# Patient Record
Sex: Male | Born: 1935 | Race: White | Hispanic: No | Smoking: Never smoker
Health system: Southern US, Community
[De-identification: ages and names within clinical notes are randomized; demographics above are authoritative.]

## PROBLEM LIST (undated history)

## (undated) DIAGNOSIS — K8020 Calculus of gallbladder without cholecystitis without obstruction: Secondary | ICD-10-CM

## (undated) DIAGNOSIS — I10 Essential (primary) hypertension: Secondary | ICD-10-CM

## (undated) DIAGNOSIS — J189 Pneumonia, unspecified organism: Secondary | ICD-10-CM

## (undated) DIAGNOSIS — M1990 Unspecified osteoarthritis, unspecified site: Secondary | ICD-10-CM

## (undated) DIAGNOSIS — R233 Spontaneous ecchymoses: Secondary | ICD-10-CM

## (undated) DIAGNOSIS — J40 Bronchitis, not specified as acute or chronic: Secondary | ICD-10-CM

## (undated) DIAGNOSIS — F329 Major depressive disorder, single episode, unspecified: Secondary | ICD-10-CM

## (undated) DIAGNOSIS — H409 Unspecified glaucoma: Secondary | ICD-10-CM

## (undated) DIAGNOSIS — J45909 Unspecified asthma, uncomplicated: Secondary | ICD-10-CM

## (undated) DIAGNOSIS — K219 Gastro-esophageal reflux disease without esophagitis: Secondary | ICD-10-CM

## (undated) DIAGNOSIS — F32A Depression, unspecified: Secondary | ICD-10-CM

## (undated) DIAGNOSIS — R0602 Shortness of breath: Secondary | ICD-10-CM

## (undated) DIAGNOSIS — H9190 Unspecified hearing loss, unspecified ear: Secondary | ICD-10-CM

## (undated) DIAGNOSIS — K449 Diaphragmatic hernia without obstruction or gangrene: Secondary | ICD-10-CM

## (undated) DIAGNOSIS — R238 Other skin changes: Secondary | ICD-10-CM

## (undated) HISTORY — DX: Shortness of breath: R06.02

## (undated) HISTORY — DX: Gastro-esophageal reflux disease without esophagitis: K21.9

## (undated) HISTORY — DX: Unspecified hearing loss, unspecified ear: H91.90

## (undated) HISTORY — DX: Spontaneous ecchymoses: R23.3

## (undated) HISTORY — DX: Other skin changes: R23.8

## (undated) HISTORY — PX: OTHER SURGICAL HISTORY: SHX169

## (undated) HISTORY — DX: Unspecified asthma, uncomplicated: J45.909

## (undated) HISTORY — DX: Calculus of gallbladder without cholecystitis without obstruction: K80.20

## (undated) HISTORY — PX: APPENDECTOMY: SHX54

## (undated) HISTORY — PX: LITHOTRIPSY: SUR834

## (undated) HISTORY — DX: Bronchitis, not specified as acute or chronic: J40

## (undated) HISTORY — DX: Essential (primary) hypertension: I10

## (undated) HISTORY — DX: Diaphragmatic hernia without obstruction or gangrene: K44.9

## (undated) HISTORY — DX: Unspecified glaucoma: H40.9

---

## 2003-03-03 HISTORY — PX: EYE SURGERY: SHX253

## 2012-04-22 HISTORY — PX: ERCP: SHX60

## 2012-04-29 ENCOUNTER — Ambulatory Visit (INDEPENDENT_AMBULATORY_CARE_PROVIDER_SITE_OTHER): Payer: Medicare Other | Admitting: General Surgery

## 2012-04-29 ENCOUNTER — Encounter (INDEPENDENT_AMBULATORY_CARE_PROVIDER_SITE_OTHER): Payer: Self-pay | Admitting: General Surgery

## 2012-04-29 VITALS — BP 126/70 | HR 74 | Temp 97.9°F | Resp 18 | Ht 66.0 in | Wt 115.2 lb

## 2012-04-29 DIAGNOSIS — K8070 Calculus of gallbladder and bile duct without cholecystitis without obstruction: Secondary | ICD-10-CM | POA: Insufficient documentation

## 2012-04-29 NOTE — Patient Instructions (Signed)
[facility] ______[facility] °LAPAROSCOPIC SURGERY: POST OP INSTRUCTIONS °Always review your discharge instruction sheet given to you by the facility where your surgery was performed. °IF YOU HAVE DISABILITY OR FAMILY LEAVE FORMS, YOU MUST BRING THEM TO THE OFFICE FOR PROCESSING.   °DO NOT GIVE THEM TO YOUR DOCTOR. ° °1. A prescription for pain medication may be given to you upon discharge.  Take your pain medication as prescribed, if needed.  If narcotic pain medicine is not needed, then you may take acetaminophen (Tylenol) or ibuprofen (Advil) as needed. °2. Take your usually prescribed medications unless otherwise directed. °3. If you need a refill on your pain medication, please contact your pharmacy.  They will contact our office to request authorization. Prescriptions will not be filled after 5pm or on week-ends. °4. You should follow a light diet the first few days after arrival home, such as soup and crackers, etc.  Be sure to include lots of fluids daily. °5. Most patients will experience some swelling and bruising in the area of the incisions.  Ice packs will help.  Swelling and bruising can take several days to resolve.  °6. It is common to experience some constipation if taking pain medication after surgery.  Increasing fluid intake and taking a stool softener (such as Colace) will usually help or prevent this problem from occurring.  A mild laxative (Milk of Magnesia or Miralax) should be taken according to package instructions if there are no bowel movements after 48 hours. °7. Unless discharge instructions indicate otherwise, you may remove your bandages 24-48 hours after surgery, and you may shower at that time.  You may have steri-strips (small skin tapes) in place directly over the incision.  These strips should be left on the skin for 7-10 days.  If your surgeon used skin glue on the incision, you may shower in 24 hours.  The glue will flake off over the next 2-3 weeks.  Any sutures or  staples will be removed at the office during your follow-up visit. °8. ACTIVITIES:  You may resume regular (light) daily activities beginning the next day--such as daily self-care, walking, climbing stairs--gradually increasing activities as tolerated.  You may have sexual intercourse when it is comfortable.  Refrain from any heavy lifting or straining until approved by your doctor. °a. You may drive when you are no longer taking prescription pain medication, you can comfortably wear a seatbelt, and you can safely maneuver your car and apply brakes. °b. RETURN TO WORK:  __________________________________________________________ °9. You should see your doctor in the office for a follow-up appointment approximately 2-3 weeks after your surgery.  Make sure that you call for this appointment within a day or two after you arrive home to insure a convenient appointment time. °10. OTHER INSTRUCTIONS: __________________________________________________________________________________________________________________________ __________________________________________________________________________________________________________________________ °WHEN TO CALL YOUR DOCTOR: °1. Fever over 101.0 °2. Inability to urinate °3. Continued bleeding from incision. °4. Increased pain, redness, or drainage from the incision. °5. Increasing abdominal pain ° °The clinic staff is available to answer your questions during regular business hours.  Please don’t hesitate to call and ask to speak to one of the nurses for clinical concerns.  If you have a medical emergency, go to the nearest emergency room or call 911.  A surgeon from [facility] is always on call at the hospital. °[address], [address], [place], [place]  [address] ? [address] [address], [place], [place]   [address] °[phone] ? [phone] ? FAX [phone] °Web site:   [email or web address] °

## 2012-04-29 NOTE — Progress Notes (Signed)
Patient ID: Lucas Kelly, male   DOB: 10/10/1925, 79 y.o.   MRN: [id number]  Chief Complaint  Patient presents with  . Cholelithiasis    HPI Lucas Kelly is a 79 y.o. male.   HPI  He is self-referred and his daughter is here with him. He has cholelithiasis and had choledocholithiasis. He underwent ERCP twice in [place] and the stone was able to be removed. His main symptoms were nausea with some vomiting. He still having some intermittent nausea especially after eating spicy foods. His daughter lives here in town and given that he would need help after surgery he presents here to discuss cholecystectomy which he is interested in having.  Past Medical History  Diagnosis Date  . Hypertension   . COPD with FEV1 38%.   . Asthma   . Shortness of breath   . Bruises easily   . Hiatal hernia   . Acid reflux   . Kidney stone   . Gallstones   . Hearing loss   . Glaucoma     Past Surgical History  Procedure Laterality Date  . Ercp  04/22/12  . Arm fracture surgery  1996 - approximate    right  . Lithotripsy  1984 - approximate  . Appendectomy      about 50 years ago, does not remember exact date   Left nephrolithomy  Family History  Problem Relation Age of Onset  . Heart attack Mother   . Hypertension Sister   . Heart disease Brother     Social History History  Substance Use Topics  . Smoking status: Never Smoker   . Smokeless tobacco: Never Used  . Alcohol Use: No    Allergies  Allergen Reactions  . Biaxin (Clarithromycin) Other (See Comments)    Hallucinations.  . Hctz (Hydrochlorothiazide) Other (See Comments)    Made patient feel like he had no energy, weak, not like himself.  . Levaquin (Levofloxacin In D5w) Other (See Comments)    Muscle spasms in lower legs.    Current Outpatient Prescriptions  Medication Sig Dispense Refill  . aspirin 81 MG tablet Take 81 mg by mouth daily.      . budesonide-formoterol (SYMBICORT) 80-4.5 MCG/ACT inhaler  Inhale 1 puff into the lungs 2 (two) times daily.      . carvedilol (COREG) 25 MG tablet daily.      . LORazepam (ATIVAN) 0.5 MG tablet Take 0.5 mg by mouth at bedtime. Patient takes half of tablet.      . losartan (COZAAR) 50 MG tablet daily.      . timolol (TIMOPTIC) 0.5 % ophthalmic solution daily.       No current facility-administered medications for this visit.    Review of Systems Review of Systems  Constitutional: Negative.   HENT: Positive for hearing loss.   Respiratory: Positive for shortness of breath.   Cardiovascular: Negative.   Gastrointestinal:       Reflux.  Hematological: Bruises/bleeds easily.    Blood pressure 126/70, pulse 74, temperature 97.9 F (36.6 C), temperature source Temporal, resp. rate 18, height 5' 6" (1.676 m), weight 115 lb 4 oz (52.277 kg).  Physical Exam Physical Exam  Constitutional:  Thin, elderly male in no acute distress.  Neck: Neck supple.  Cardiovascular: Normal rate and regular rhythm.   Pulmonary/Chest: Effort normal.  Breath sounds distant but clear. No wheezing.  Abdominal: Soft. He exhibits no distension and no mass. There is no tenderness.  Right lower quadrant scar.   Left flank scar.  Musculoskeletal: He exhibits no edema.  Some ecchymosis in the upper extremities. Some muscular wasting  Lymphadenopathy:    He has no cervical adenopathy.  Neurological: He is alert.  Skin: Skin is warm and dry.    Data Reviewed ERCP report. Primary care notes.  Assessment    Symptomatic cholelithiasis and choledocholithiasis. ERCP and stone extraction has been performed. He had no problems with anesthesia during this procedure. He is interested in having a cholecystectomy.     Plan    Laparoscopic possible open cholecystectomy with cholangiogram.  I have explained the procedure, risks, and aftercare of cholecystectomy.  Risks include but are not limited to bleeding, infection, wound problems, anesthesia, diarrhea, bile leak, injury  to common bile duct/liver/intestine.  They seem to understand and agrees to proceed.         Kamarius Buckbee J 04/29/2012, 2:59 PM    

## 2012-05-03 ENCOUNTER — Encounter (HOSPITAL_COMMUNITY): Payer: Self-pay | Admitting: Pharmacy Technician

## 2012-05-06 ENCOUNTER — Encounter (HOSPITAL_COMMUNITY)
Admission: RE | Admit: 2012-05-06 | Discharge: 2012-05-06 | Disposition: A | Discharge status: Home / Self Care | Payer: Medicare Other | Source: Ambulatory Visit | Attending: General Surgery | Admitting: General Surgery

## 2012-05-06 ENCOUNTER — Ambulatory Visit (HOSPITAL_COMMUNITY)
Admission: RE | Admit: 2012-05-06 | Discharge: 2012-05-06 | Disposition: A | Discharge status: Home / Self Care | Payer: Medicare Other | Source: Ambulatory Visit | Attending: General Surgery | Admitting: General Surgery

## 2012-05-06 ENCOUNTER — Encounter (HOSPITAL_COMMUNITY): Payer: Self-pay

## 2012-05-06 DIAGNOSIS — K449 Diaphragmatic hernia without obstruction or gangrene: Secondary | ICD-10-CM | POA: Insufficient documentation

## 2012-05-06 DIAGNOSIS — I498 Other specified cardiac arrhythmias: Secondary | ICD-10-CM | POA: Insufficient documentation

## 2012-05-06 DIAGNOSIS — M4120 Other idiopathic scoliosis, site unspecified: Secondary | ICD-10-CM | POA: Insufficient documentation

## 2012-05-06 DIAGNOSIS — Z01810 Encounter for preprocedural cardiovascular examination: Secondary | ICD-10-CM | POA: Insufficient documentation

## 2012-05-06 DIAGNOSIS — R0602 Shortness of breath: Secondary | ICD-10-CM | POA: Insufficient documentation

## 2012-05-06 DIAGNOSIS — Z01818 Encounter for other preprocedural examination: Secondary | ICD-10-CM | POA: Insufficient documentation

## 2012-05-06 DIAGNOSIS — I10 Essential (primary) hypertension: Secondary | ICD-10-CM | POA: Insufficient documentation

## 2012-05-06 DIAGNOSIS — Z01812 Encounter for preprocedural laboratory examination: Secondary | ICD-10-CM | POA: Insufficient documentation

## 2012-05-06 HISTORY — DX: Major depressive disorder, single episode, unspecified: F32.9

## 2012-05-06 HISTORY — DX: Pneumonia, unspecified organism: J18.9

## 2012-05-06 HISTORY — DX: Depression, unspecified: F32.A

## 2012-05-06 HISTORY — DX: Unspecified osteoarthritis, unspecified site: M19.90

## 2012-05-06 LAB — CBC
HCT: 41.5 % (ref 39.0–52.0)
MCV: 96.5 fL (ref 78.0–100.0)
Platelets: 205 10*3/uL (ref 150–400)
RBC: 4.3 MIL/uL (ref 4.22–5.81)
WBC: 6.4 10*3/uL (ref 4.0–10.5)

## 2012-05-06 LAB — COMPREHENSIVE METABOLIC PANEL
AST: 19 U/L (ref 0–37)
Alkaline Phosphatase: 68 U/L (ref 39–117)
BUN: 22 mg/dL (ref 6–23)
CO2: 30 mEq/L (ref 19–32)
Chloride: 101 mEq/L (ref 96–112)
Creatinine, Ser: 0.9 mg/dL (ref 0.50–1.35)
GFR calc non Af Amer: 75 mL/min — ABNORMAL LOW (ref >90)
Total Bilirubin: 0.8 mg/dL (ref 0.3–1.2)

## 2012-05-06 LAB — PROTIME-INR: INR: 0.97 (ref 0.00–1.49)

## 2012-05-06 LAB — SURGICAL PCR SCREEN
MRSA, PCR: NEGATIVE
Staphylococcus aureus: NEGATIVE

## 2012-05-06 NOTE — Progress Notes (Signed)
05/06/12 1430  OBSTRUCTIVE SLEEP APNEA  Have you ever been diagnosed with sleep apnea through a sleep study? No  Do you snore loudly (loud enough to be heard through closed doors)?  0  Do you often feel tired, fatigued, or sleepy during the daytime? 1  Has anyone observed you stop breathing during your sleep? 0  Do you have, or are you being treated for high blood pressure? 1  BMI more than 35 kg/m2? 0  Age over 77 years old? 1  Neck circumference greater than 40 cm/18 inches? 0  Gender: 1  Obstructive Sleep Apnea Score 4  Score 4 or greater  Results sent to PCP

## 2012-05-06 NOTE — Patient Instructions (Signed)
[address] Audy A. Klar  05/06/2012   Your procedure is scheduled on: 05/10/12  Report to [facility] at 1130 AM.  Call this number if you have problems the morning of surgery [phone]   Remember:   Do not eat food  After Midnight 05/09/12, clear liquids from midnight until 0800am 05/10/12 then nothing     Take these medicines the morning of surgery with A SIP OF WATER: coreg, pepcid if needed, symbicort   Do not wear jewelry, make-up or nail polish.  Do not wear lotions, powders, or perfumes. You may wear deodorant.  Do not shave 48 hours prior to surgery. Men may shave face and neck.  Do not bring valuables to the hospital.  Contacts, dentures or bridgework may not be worn into surgery.  Leave suitcase in the car. After surgery it may be brought to your room.  For patients admitted to the hospital, checkout time is 11:00 AM the day of discharge.     Please read over the following fact sheets that you were given: MRSA Information, clear liquids fact sheet Birdie Sons, RN  pre op nurse call if needed [phone]    FAILURE TO FOLLOW THESE INSTRUCTIONS MAY RESULT IN CANCELLATION OF YOUR SURGERY   Patient Signature: ___________________________________________

## 2012-05-06 NOTE — Progress Notes (Signed)
stress test 04/10/11 on chart, ECHO 03/05/10 on chart

## 2012-05-10 ENCOUNTER — Encounter (HOSPITAL_COMMUNITY)
Admission: RE | Disposition: A | Discharge status: Home / Self Care | Payer: Self-pay | Source: Ambulatory Visit | Attending: General Surgery

## 2012-05-10 ENCOUNTER — Encounter (HOSPITAL_COMMUNITY): Payer: Self-pay | Admitting: Certified Registered Nurse Anesthetist

## 2012-05-10 ENCOUNTER — Ambulatory Visit (HOSPITAL_COMMUNITY): Payer: Medicare Other | Admitting: Certified Registered Nurse Anesthetist

## 2012-05-10 ENCOUNTER — Ambulatory Visit (HOSPITAL_COMMUNITY): Payer: Medicare Other

## 2012-05-10 ENCOUNTER — Encounter (HOSPITAL_COMMUNITY): Payer: Self-pay | Admitting: General Practice

## 2012-05-10 ENCOUNTER — Ambulatory Visit (HOSPITAL_COMMUNITY)
Admission: RE | Admit: 2012-05-10 | Discharge: 2012-05-11 | Disposition: A | Discharge status: Home / Self Care | Payer: Medicare Other | Source: Ambulatory Visit | Attending: General Surgery | Admitting: General Surgery

## 2012-05-10 DIAGNOSIS — K8010 Calculus of gallbladder with chronic cholecystitis without obstruction: Secondary | ICD-10-CM

## 2012-05-10 DIAGNOSIS — K449 Diaphragmatic hernia without obstruction or gangrene: Secondary | ICD-10-CM | POA: Insufficient documentation

## 2012-05-10 DIAGNOSIS — K219 Gastro-esophageal reflux disease without esophagitis: Secondary | ICD-10-CM | POA: Insufficient documentation

## 2012-05-10 DIAGNOSIS — J4489 Other specified chronic obstructive pulmonary disease: Secondary | ICD-10-CM | POA: Insufficient documentation

## 2012-05-10 DIAGNOSIS — H409 Unspecified glaucoma: Secondary | ICD-10-CM | POA: Insufficient documentation

## 2012-05-10 DIAGNOSIS — Z7982 Long term (current) use of aspirin: Secondary | ICD-10-CM | POA: Insufficient documentation

## 2012-05-10 DIAGNOSIS — I10 Essential (primary) hypertension: Secondary | ICD-10-CM | POA: Insufficient documentation

## 2012-05-10 DIAGNOSIS — R110 Nausea: Secondary | ICD-10-CM | POA: Insufficient documentation

## 2012-05-10 DIAGNOSIS — Z79899 Other long term (current) drug therapy: Secondary | ICD-10-CM | POA: Insufficient documentation

## 2012-05-10 DIAGNOSIS — J449 Chronic obstructive pulmonary disease, unspecified: Secondary | ICD-10-CM | POA: Insufficient documentation

## 2012-05-10 HISTORY — PX: CHOLECYSTECTOMY: SHX55

## 2012-05-10 SURGERY — LAPAROSCOPIC CHOLECYSTECTOMY WITH INTRAOPERATIVE CHOLANGIOGRAM
Anesthesia: General | Site: Abdomen | Wound class: Clean Contaminated

## 2012-05-10 MED ORDER — LIDOCAINE HCL (CARDIAC) 20 MG/ML IV SOLN
INTRAVENOUS | Status: DC | PRN
  Administered 2012-05-10: 100 mg via INTRAVENOUS

## 2012-05-10 MED ORDER — BUPIVACAINE HCL (PF) 0.5 % IJ SOLN
INTRAMUSCULAR | Status: AC
  Filled 2012-05-10: qty 30

## 2012-05-10 MED ORDER — IOHEXOL 300 MG/ML  SOLN
INTRAMUSCULAR | Status: DC | PRN
  Administered 2012-05-10: 4.5 mL via INTRAVENOUS

## 2012-05-10 MED ORDER — PROPOFOL 10 MG/ML IV BOLUS
INTRAVENOUS | Status: DC | PRN
  Administered 2012-05-10: 100 mg via INTRAVENOUS

## 2012-05-10 MED ORDER — EPHEDRINE SULFATE 50 MG/ML IJ SOLN
INTRAMUSCULAR | Status: DC | PRN
  Administered 2012-05-10: 10 mg via INTRAVENOUS
  Administered 2012-05-10: 5 mg via INTRAVENOUS

## 2012-05-10 MED ORDER — LACTATED RINGERS IV SOLN
INTRAVENOUS | Status: DC
  Administered 2012-05-10: 14:00:00 via INTRAVENOUS

## 2012-05-10 MED ORDER — KCL-LACTATED RINGERS-D5W 20 MEQ/L IV SOLN
INTRAVENOUS | Status: DC
  Administered 2012-05-10 – 2012-05-11 (×2): via INTRAVENOUS
  Filled 2012-05-10 (×2): qty 1000

## 2012-05-10 MED ORDER — SUCCINYLCHOLINE CHLORIDE 20 MG/ML IJ SOLN
INTRAMUSCULAR | Status: DC | PRN
  Administered 2012-05-10: 100 mg via INTRAVENOUS

## 2012-05-10 MED ORDER — NEOSTIGMINE METHYLSULFATE 1 MG/ML IJ SOLN
INTRAMUSCULAR | Status: DC | PRN
  Administered 2012-05-10: 5 mg via INTRAVENOUS

## 2012-05-10 MED ORDER — CEFAZOLIN SODIUM-DEXTROSE 2-3 GM-% IV SOLR
2.0000 g | INTRAVENOUS | Status: AC
  Administered 2012-05-10: 2 g via INTRAVENOUS

## 2012-05-10 MED ORDER — FAMOTIDINE 20 MG PO TABS
20.0000 mg | ORAL_TABLET | Freq: Two times a day (BID) | ORAL | Status: DC | PRN
  Administered 2012-05-11: 20 mg via ORAL
  Filled 2012-05-10 (×2): qty 1

## 2012-05-10 MED ORDER — ONDANSETRON HCL 4 MG/2ML IJ SOLN
INTRAMUSCULAR | Status: DC | PRN
  Administered 2012-05-10: 4 mg via INTRAVENOUS

## 2012-05-10 MED ORDER — IOHEXOL 300 MG/ML  SOLN
INTRAMUSCULAR | Status: AC
  Filled 2012-05-10: qty 1

## 2012-05-10 MED ORDER — MORPHINE SULFATE 2 MG/ML IJ SOLN
1.0000 mg | INTRAMUSCULAR | Status: DC | PRN
  Administered 2012-05-10 (×3): 2 mg via INTRAVENOUS
  Filled 2012-05-10 (×3): qty 1

## 2012-05-10 MED ORDER — HYDROCODONE-ACETAMINOPHEN 5-325 MG PO TABS
1.0000 | ORAL_TABLET | ORAL | Status: DC | PRN
  Administered 2012-05-11: 1 via ORAL
  Filled 2012-05-10: qty 1

## 2012-05-10 MED ORDER — BUPIVACAINE HCL 0.5 % IJ SOLN
INTRAMUSCULAR | Status: DC | PRN
  Administered 2012-05-10: 15 mL

## 2012-05-10 MED ORDER — LACTATED RINGERS IR SOLN
Status: DC | PRN
  Administered 2012-05-10: 1000 mL

## 2012-05-10 MED ORDER — CEFAZOLIN SODIUM-DEXTROSE 2-3 GM-% IV SOLR
INTRAVENOUS | Status: AC
  Filled 2012-05-10: qty 50

## 2012-05-10 MED ORDER — LACTATED RINGERS IV SOLN: INTRAVENOUS | Status: DC

## 2012-05-10 MED ORDER — ATROPINE SULFATE 0.4 MG/ML IJ SOLN
INTRAMUSCULAR | Status: DC | PRN
  Administered 2012-05-10: 0.4 mg via INTRAVENOUS

## 2012-05-10 MED ORDER — LOSARTAN POTASSIUM 50 MG PO TABS
50.0000 mg | ORAL_TABLET | Freq: Every morning | ORAL | Status: DC
  Filled 2012-05-10: qty 1

## 2012-05-10 MED ORDER — LORAZEPAM 0.5 MG PO TABS
0.2500 mg | ORAL_TABLET | Freq: Every evening | ORAL | Status: DC | PRN
  Administered 2012-05-10: 0.25 mg via ORAL
  Filled 2012-05-10: qty 1

## 2012-05-10 MED ORDER — BUDESONIDE-FORMOTEROL FUMARATE 80-4.5 MCG/ACT IN AERO
1.0000 | INHALATION_SPRAY | Freq: Two times a day (BID) | RESPIRATORY_TRACT | Status: DC
  Administered 2012-05-10: 1 via RESPIRATORY_TRACT
  Filled 2012-05-10: qty 6.9

## 2012-05-10 MED ORDER — CARVEDILOL 12.5 MG PO TABS
12.5000 mg | ORAL_TABLET | Freq: Two times a day (BID) | ORAL | Status: DC
  Administered 2012-05-10 – 2012-05-11 (×2): 12.5 mg via ORAL
  Filled 2012-05-10 (×4): qty 1

## 2012-05-10 MED ORDER — DEXAMETHASONE SODIUM PHOSPHATE 10 MG/ML IJ SOLN
INTRAMUSCULAR | Status: DC | PRN
  Administered 2012-05-10: 10 mg via INTRAVENOUS

## 2012-05-10 MED ORDER — FENTANYL CITRATE 0.05 MG/ML IJ SOLN
INTRAMUSCULAR | Status: DC | PRN
  Administered 2012-05-10: 50 ug via INTRAVENOUS
  Administered 2012-05-10 (×2): 25 ug via INTRAVENOUS

## 2012-05-10 MED ORDER — HEMOSTATIC AGENTS (NO CHARGE) OPTIME
TOPICAL | Status: DC | PRN
  Administered 2012-05-10: 1 via TOPICAL

## 2012-05-10 MED ORDER — TIMOLOL MALEATE 0.5 % OP SOLN
1.0000 [drp] | Freq: Every day | OPHTHALMIC | Status: DC
  Administered 2012-05-11: 1 [drp] via OPHTHALMIC
  Filled 2012-05-10: qty 5

## 2012-05-10 MED ORDER — LIP MEDEX EX OINT
TOPICAL_OINTMENT | CUTANEOUS | Status: AC
  Administered 2012-05-10: 18:00:00
  Filled 2012-05-10: qty 7

## 2012-05-10 MED ORDER — HYDROMORPHONE HCL PF 1 MG/ML IJ SOLN: 0.2500 mg | INTRAMUSCULAR | Status: DC | PRN

## 2012-05-10 MED ORDER — GLYCOPYRROLATE 0.2 MG/ML IJ SOLN
INTRAMUSCULAR | Status: DC | PRN
  Administered 2012-05-10: 0.6 mg via INTRAVENOUS

## 2012-05-10 MED ORDER — 0.9 % SODIUM CHLORIDE (POUR BTL) OPTIME
TOPICAL | Status: DC | PRN
  Administered 2012-05-10: 1000 mL

## 2012-05-10 MED ORDER — ROCURONIUM BROMIDE 100 MG/10ML IV SOLN
INTRAVENOUS | Status: DC | PRN
  Administered 2012-05-10: 30 mg via INTRAVENOUS

## 2012-05-10 SURGICAL SUPPLY — 43 items
APPLIER CLIP 5 13 M/L LIGAMAX5 (MISCELLANEOUS) ×2
APPLIER CLIP ROT 10 11.4 M/L (STAPLE)
BENZOIN TINCTURE PRP APPL 2/3 (GAUZE/BANDAGES/DRESSINGS) ×2 IMPLANT
CANISTER SUCTION 2500CC (MISCELLANEOUS) ×2 IMPLANT
CHLORAPREP W/TINT 26ML (MISCELLANEOUS) ×2 IMPLANT
CLIP APPLIE 5 13 M/L LIGAMAX5 (MISCELLANEOUS) ×1 IMPLANT
CLIP APPLIE ROT 10 11.4 M/L (STAPLE) IMPLANT
CLOTH BEACON ORANGE TIMEOUT ST (SAFETY) ×2 IMPLANT
COVER MAYO STAND STRL (DRAPES) IMPLANT
COVER SURGICAL LIGHT HANDLE (MISCELLANEOUS) IMPLANT
DECANTER SPIKE VIAL GLASS SM (MISCELLANEOUS) ×2 IMPLANT
DRAPE C-ARM 42X72 X-RAY (DRAPES) IMPLANT
DRAPE LAPAROSCOPIC ABDOMINAL (DRAPES) ×2 IMPLANT
DRAPE UTILITY XL STRL (DRAPES) ×2 IMPLANT
DRSG TEGADERM 2-3/8X2-3/4 SM (GAUZE/BANDAGES/DRESSINGS) ×2 IMPLANT
ELECT REM PT RETURN 9FT ADLT (ELECTROSURGICAL) ×2
ELECTRODE REM PT RTRN 9FT ADLT (ELECTROSURGICAL) ×1 IMPLANT
ENDOLOOP SUT PDS II  0 18 (SUTURE)
ENDOLOOP SUT PDS II 0 18 (SUTURE) IMPLANT
GAUZE SPONGE 2X2 8PLY STRL LF (GAUZE/BANDAGES/DRESSINGS) ×1 IMPLANT
GLOVE BIOGEL PI IND STRL 7.0 (GLOVE) ×1 IMPLANT
GLOVE BIOGEL PI INDICATOR 7.0 (GLOVE) ×1
GLOVE ECLIPSE 8.0 STRL XLNG CF (GLOVE) ×2 IMPLANT
GLOVE INDICATOR 8.0 STRL GRN (GLOVE) ×4 IMPLANT
GOWN STRL NON-REIN LRG LVL3 (GOWN DISPOSABLE) ×2 IMPLANT
GOWN STRL REIN XL XLG (GOWN DISPOSABLE) ×4 IMPLANT
HEMOSTAT SURGICEL 4X8 (HEMOSTASIS) IMPLANT
IV CATH 14GX2 1/4 (CATHETERS) IMPLANT
KIT BASIN OR (CUSTOM PROCEDURE TRAY) ×2 IMPLANT
NS IRRIG 1000ML POUR BTL (IV SOLUTION) IMPLANT
POUCH SPECIMEN RETRIEVAL 10MM (ENDOMECHANICALS) IMPLANT
SET CHOLANGIOGRAPH MIX (MISCELLANEOUS) ×2 IMPLANT
SET IRRIG TUBING LAPAROSCOPIC (IRRIGATION / IRRIGATOR) ×2 IMPLANT
SOLUTION ANTI FOG 6CC (MISCELLANEOUS) ×2 IMPLANT
SPONGE GAUZE 2X2 STER 10/PKG (GAUZE/BANDAGES/DRESSINGS) ×1
STRIP CLOSURE SKIN 1/2X4 (GAUZE/BANDAGES/DRESSINGS) ×2 IMPLANT
SUT MNCRL AB 4-0 PS2 18 (SUTURE) ×4 IMPLANT
TOWEL OR 17X26 10 PK STRL BLUE (TOWEL DISPOSABLE) ×2 IMPLANT
TRAY LAP CHOLE (CUSTOM PROCEDURE TRAY) ×2 IMPLANT
TROCAR BLADELESS OPT 5 75 (ENDOMECHANICALS) ×6 IMPLANT
TROCAR XCEL BLUNT TIP 100MML (ENDOMECHANICALS) ×2 IMPLANT
TROCAR XCEL NON-BLD 11X100MML (ENDOMECHANICALS) IMPLANT
TUBING INSUFFLATION 10FT LAP (TUBING) ×2 IMPLANT

## 2012-05-10 NOTE — Op Note (Signed)
Preoperative diagnosis:  Symptomatic cholelithiasis, history of choledocholithiasis s/p ERCP  Postoperative diagnosis:  Same  Procedure: Laparoscopic cholecystectomy with cholangiogram.  Surgeon: Avel Peace, M.D.  Asst.:  Glenna Fellows, M.D.  Anesthesia: Gen.  Indication:   This is an 79 year old male with a history of cholelithiasis and choledocholithiasis status post ERCP. He now presents for elective cholecystectomy.  Technique: He was brought to the operating room, placed supine on the operating table, and a general anesthetic was administered. The hair on the abdominal wall was clipped as was necessary. The abdominal wall was then sterilely prepped and draped. Local anesthetic (Marcaine) was infiltrated in the subumbilical region. A small subumbilical incision was made through the skin, subcutaneous tissue, fascia, and peritoneum entering the peritoneal cavity under direct vision. A pursestring suture of 0 Vicryl was placed around the edges of the fascia. A Hassan trocar was introduced into the peritoneal cavity and a pneumoperitoneum was created by insufflation of carbon dioxide gas. The laparoscope was introduced into the trocar and no underlying bleeding or organ injury was noted. He was then placed in the reverse Trendelenburg position with the right side tilted slightly up.  Three more trochars were then placed into the abdominal cavity under laparoscopic vision. One in the epigastric area, and 2 in the right upper quadrant area. The gallbladder was visualized and the fundus was grasped and retracted toward the right shoulder. No acute inflammatory changes or significant adhesions were present.  The infundibulum was mobilized with dissection close to the gallbladder and retracted laterally. The cystic duct was identified and a window was created around it. The cystic artery was also identified and a window was created around it. The critical view was achieved. A clip was placed at  the neck of the gallbladder. A small incision was made in the cystic duct. A cholangiocatheter was introduced through the anterior abdominal wall and placed in the cystic duct. A intraoperative cholangiogram was then performed.  Under real-time fluoroscopy, dilute contrast was injected into the cystic duct.  The common hepatic duct, the right and left hepatic ducts, and the common duct were all visualized. Contrast drained into the duodenum without obvious evidence of any obstructing ductal lesion. The final report is pending the Radiologist's interpretation.  The cholangiocatheter was removed, the cystic duct was clipped 3 times on the biliary side, and then the cystic duct was divided sharply. No bile leak was noted from the cystic duct stump.  The cystic artery was then clipped and divided. Following this the gallbladder was dissected free from the liver using electrocautery. The gallbladder was then placed in a retrieval bag and removed from the abdominal cavity through the subumbilical incision.  The gallbladder fossa was inspected, irrigated, and bleeding was controlled with electrocautery. Inspection showed that hemostasis was adequate and there was no evidence of bile leak. A piece of Surgicel was placed in the gallbladder fossa. The irrigation fluid was evacuated as much as possible.  The subumbilical trocar was removed and the fascial defect was closed by tightening and tying down the pursestring suture under laparoscopic vision.  The remaining trochars were removed and the pneumoperitoneum was released. The skin incisions were closed with 4-0 Monocryl subcuticular stitches. Steri-Strips and sterile dressings were applied.  The procedure was well-tolerated without any apparent complications. The patient was taken to the recovery room in satisfactory condition.

## 2012-05-10 NOTE — Anesthesia Preprocedure Evaluation (Addendum)
Anesthesia Evaluation  Patient identified by MRN, date of birth, ID band Patient awake    Reviewed: Allergy & Precautions, H&P , NPO status , Patient's Chart, lab work & pertinent test results, reviewed documented beta blocker date and time   Airway Mallampati: II TM Distance: >3 FB Neck ROM: full    Dental no notable dental hx. (+) Teeth Intact and Dental Advisory Given   Pulmonary shortness of breath and with exertion, COPD Occasional bronchitis and mild COPD breath sounds clear to auscultation  Pulmonary exam normal       Cardiovascular Exercise Tolerance: Good hypertension, Pt. on home beta blockers Rhythm:regular Rate:Normal     Neuro/Psych negative neurological ROS  negative psych ROS   GI/Hepatic negative GI ROS, Neg liver ROS, hiatal hernia, GERD-  Medicated and Controlled,  Endo/Other  negative endocrine ROS  Renal/GU negative Renal ROS  negative genitourinary   Musculoskeletal   Abdominal   Peds  Hematology negative hematology ROS (+)   Anesthesia Other Findings   Reproductive/Obstetrics negative OB ROS                          Anesthesia Physical Anesthesia Plan  ASA: II  Anesthesia Plan: General   Post-op Pain Management:    Induction: Intravenous  Airway Management Planned: Oral ETT  Additional Equipment:   Intra-op Plan:   Post-operative Plan: Extubation in OR  Informed Consent: I have reviewed the patients History and Physical, chart, labs and discussed the procedure including the risks, benefits and alternatives for the proposed anesthesia with the patient or authorized representative who has indicated his/her understanding and acceptance.   Dental Advisory Given  Plan Discussed with: CRNA and Surgeon  Anesthesia Plan Comments:        Anesthesia Quick Evaluation

## 2012-05-10 NOTE — H&P (View-Only) (Signed)
Patient ID: Lucas Kelly, male   DOB: 1925-10-21, 79 y.o.   MRN: [id number]  Chief Complaint  Patient presents with  . Cholelithiasis    HPI Lucas Kelly is a 79 y.o. male.   HPI  He is self-referred and his daughter is here with him. He has cholelithiasis and had choledocholithiasis. He underwent ERCP twice in [facility] and the stone was able to be removed. His main symptoms were nausea with some vomiting. He still having some intermittent nausea especially after eating spicy foods. His daughter lives here in town and given that he would need help after surgery he presents here to discuss cholecystectomy which he is interested in having.  Past Medical History  Diagnosis Date  . Hypertension   . COPD with FEV1 38%.   . Asthma   . Shortness of breath   . Bruises easily   . Hiatal hernia   . Acid reflux   . Kidney stone   . Gallstones   . Hearing loss   . Glaucoma     Past Surgical History  Procedure Laterality Date  . Ercp  04/22/12  . Arm fracture surgery  1996 - approximate    right  . Lithotripsy  1984 - approximate  . Appendectomy      about 50 years ago, does not remember exact date   Left nephrolithomy  Family History  Problem Relation Age of Onset  . Heart attack Mother   . Hypertension Sister   . Heart disease Brother     Social History History  Substance Use Topics  . Smoking status: Never Smoker   . Smokeless tobacco: Never Used  . Alcohol Use: No    Allergies  Allergen Reactions  . Biaxin (Clarithromycin) Other (See Comments)    Hallucinations.  . Hctz (Hydrochlorothiazide) Other (See Comments)    Made patient feel like he had no energy, weak, not like himself.  . Levaquin (Levofloxacin In D5w) Other (See Comments)    Muscle spasms in lower legs.    Current Outpatient Prescriptions  Medication Sig Dispense Refill  . aspirin 81 MG tablet Take 81 mg by mouth daily.      . budesonide-formoterol (SYMBICORT) 80-4.5 MCG/ACT inhaler  Inhale 1 puff into the lungs 2 (two) times daily.      . carvedilol (COREG) 25 MG tablet daily.      Marland Kitchen LORazepam (ATIVAN) 0.5 MG tablet Take 0.5 mg by mouth at bedtime. Patient takes half of tablet.      Marland Kitchen losartan (COZAAR) 50 MG tablet daily.      . timolol (TIMOPTIC) 0.5 % ophthalmic solution daily.       No current facility-administered medications for this visit.    Review of Systems Review of Systems  Constitutional: Negative.   HENT: Positive for hearing loss.   Respiratory: Positive for shortness of breath.   Cardiovascular: Negative.   Gastrointestinal:       Reflux.  Hematological: Bruises/bleeds easily.    Blood pressure 126/70, pulse 74, temperature 97.9 F (36.6 C), temperature source Temporal, resp. rate 18, height 5\' 6"  (1.676 m), weight 115 lb 4 oz (52.277 kg).  Physical Exam Physical Exam  Constitutional:  Thin, elderly male in no acute distress.  Neck: Neck supple.  Cardiovascular: Normal rate and regular rhythm.   Pulmonary/Chest: Effort normal.  Breath sounds distant but clear. No wheezing.  Abdominal: Soft. He exhibits no distension and no mass. There is no tenderness.  Right lower quadrant scar.  Left flank scar.  Musculoskeletal: He exhibits no edema.  Some ecchymosis in the upper extremities. Some muscular wasting  Lymphadenopathy:    He has no cervical adenopathy.  Neurological: He is alert.  Skin: Skin is warm and dry.    Data Reviewed ERCP report. Primary care notes.  Assessment    Symptomatic cholelithiasis and choledocholithiasis. ERCP and stone extraction has been performed. He had no problems with anesthesia during this procedure. He is interested in having a cholecystectomy.     Plan    Laparoscopic possible open cholecystectomy with cholangiogram.  I have explained the procedure, risks, and aftercare of cholecystectomy.  Risks include but are not limited to bleeding, infection, wound problems, anesthesia, diarrhea, bile leak, injury  to common bile duct/liver/intestine.  They seem to understand and agrees to proceed.         Hellen Shanley J 04/29/2012, 2:59 PM

## 2012-05-10 NOTE — Anesthesia Postprocedure Evaluation (Signed)
  Anesthesia Post-op Note  Patient: Lucas Kelly  Procedure(s) Performed: Procedure(s) (LRB): LAPAROSCOPIC CHOLECYSTECTOMY WITH INTRAOPERATIVE CHOLANGIOGRAM (N/A)  Patient Location: PACU  Anesthesia Type: General  Level of Consciousness: awake and alert   Airway and Oxygen Therapy: Patient Spontanous Breathing  Post-op Pain: mild  Post-op Assessment: Post-op Vital signs reviewed, Patient's Cardiovascular Status Stable, Respiratory Function Stable, Patent Airway and No signs of Nausea or vomiting  Last Vitals:  Filed Vitals:   05/10/12 1545  BP: 149/70  Pulse: 56  Temp:   Resp: 19    Post-op Vital Signs: stable   Complications: No apparent anesthesia complications

## 2012-05-10 NOTE — Interval H&P Note (Signed)
History and Physical Interval Note:  05/10/2012 1:57 PM  Lucas Kelly  has presented today for surgery, with the diagnosis of cholelithiasis  The various methods of treatment have been discussed with the patient and family. After consideration of risks, benefits and other options for treatment, the patient has consented to  Procedure(s): LAPAROSCOPIC CHOLECYSTECTOMY WITH INTRAOPERATIVE CHOLANGIOGRAM (N/A) as a surgical intervention .  The patient's history has been reviewed, patient examined, no change in status, stable for surgery.  I have reviewed the patient's chart and labs.  Questions were answered to the patient's satisfaction.     Jacora Hopkins Shela Commons

## 2012-05-10 NOTE — Preoperative (Signed)
Beta Blockers   Reason not to administer Beta Blockers:Not Applicable Pt took Beta Blocker 05-10-12 AM 

## 2012-05-10 NOTE — Transfer of Care (Signed)
Immediate Anesthesia Transfer of Care Note  Patient: Lucas Kelly  Procedure(s) Performed: Procedure(s) (LRB): LAPAROSCOPIC CHOLECYSTECTOMY WITH INTRAOPERATIVE CHOLANGIOGRAM (N/A)  Patient Location: PACU  Anesthesia Type: General  Level of Consciousness: sedated, patient cooperative and responds to stimulaton  Airway & Oxygen Therapy: Patient Spontanous Breathing and Patient connected to face mask oxgen  Post-op Assessment: Report given to PACU RN and Post -op Vital signs reviewed and stable  Post vital signs: Reviewed and stable  Complications: No apparent anesthesia complications

## 2012-05-11 ENCOUNTER — Encounter (HOSPITAL_COMMUNITY): Payer: Self-pay | Admitting: General Surgery

## 2012-05-11 MED ORDER — HYDROCODONE-ACETAMINOPHEN 5-325 MG PO TABS: 1.0000 | ORAL_TABLET | ORAL | Status: AC | PRN

## 2012-05-11 NOTE — Progress Notes (Signed)
1 Day Post-Op  Subjective: Tolerating clear liquids well.  Had a little nausea after milk and grits.  Objective: Vital signs in last 24 hours: Temp:  [97.3 F (36.3 C)-98.6 F (37 C)] 97.7 F (36.5 C) (03/12 0519) Pulse Rate:  [54-99] 78 (03/12 0519) Resp:  [13-22] 16 (03/12 0519) BP: (133-179)/(70-86) 138/74 mmHg (03/12 0519) SpO2:  [93 %-100 %] 94 % (03/12 0519) Weight:  [113 lb 9.6 oz (51.529 kg)] 113 lb 9.6 oz (51.529 kg) (03/11 1630)    Intake/Output from previous day: 03/11 0701 - 03/12 0700 In: 2425 [I.V.:2425] Out: 850 [Urine:850] Intake/Output this shift: Total I/O In: -  Out: 400 [Urine:400]  PE: General- In NAD Abdomen-soft, dressings dry  Lab Results:  No results found for this basename: WBC, HGB, HCT, PLT,  in the last 72 hours BMET No results found for this basename: NA, K, CL, CO2, GLUCOSE, BUN, CREATININE, CALCIUM,  in the last 72 hours PT/INR No results found for this basename: LABPROT, INR,  in the last 72 hours Comprehensive Metabolic Panel:    Component Value Date/Time   NA 140 05/06/2012 1515   K 4.2 05/06/2012 1515   CL 101 05/06/2012 1515   CO2 30 05/06/2012 1515   BUN 22 05/06/2012 1515   CREATININE 0.90 05/06/2012 1515   GLUCOSE 103* 05/06/2012 1515   CALCIUM 9.0 05/06/2012 1515   AST 19 05/06/2012 1515   ALT 11 05/06/2012 1515   ALKPHOS 68 05/06/2012 1515   BILITOT 0.8 05/06/2012 1515   PROT 7.0 05/06/2012 1515   ALBUMIN 3.4* 05/06/2012 1515     Studies/Results: Dg Cholangiogram Operative  05/10/2012  *RADIOLOGY REPORT*  Clinical Data:   Cholelithiasis  INTRAOPERATIVE CHOLANGIOGRAM  Technique:  Cholangiographic images from the C-arm fluoroscopic device were submitted for interpretation post-operatively.  Please see the procedural report for the amount of contrast and the fluoroscopy time utilized.  Comparison:  None.  Findings:  Contrast fills the biliary tree and duodenum compatible with patency.  The common bile duct, however is dilated and there is only a  wisp of contrast crossing the ampulla of Vater. Narrowing at the ampulla is smooth.  No filling defects in the common bile duct to suggest common duct stones.  IMPRESSION: The biliary tree is patent and there is no evidence of common bile duct stones.  There is however dilatation of the common bile duct and smooth narrowing at the ampulla.   Original Report Authenticated By: Jolaine Click, M.D.     Anti-infectives: Anti-infectives   Start     Dose/Rate Route Frequency Ordered Stop   05/10/12 1141  ceFAZolin (ANCEF) IVPB 2 g/50 mL premix     2 g 100 mL/hr over 30 Minutes Intravenous On call to O.R. 05/10/12 1141 05/10/12 1400      Assessment   Post laparoscopic cholecystectomy with IOC-stable overnight    LOS: 1 day   Plan: Discharge to home.  Continue light diet for another 24 hours.  Instructions given.   ROSENBOWER,TODD J 05/11/2012

## 2012-05-12 NOTE — Discharge Summary (Signed)
Physician Discharge Summary  Patient ID: Lucas Kelly MRN: [id number] DOB/AGE: [birth year] 77 y.o.  Admit date: 05/10/2012 Discharge date: 05/11/2012  Admission Diagnoses:  Symptomatic cholelithiasis with history of choledocholithiasis status post ERCP  Discharge Diagnoses:   Same    Discharged Condition: good  Hospital Course: He underwent laparoscopic cholecystectomy with cholangiogram. He tolerated this well. The day after the operation he is tolerating a liquid diet, voiding, and ambulatory. He was felt to be ready for discharge. Discharge instructions were given to him and his daughter.  Consults: None  Significant Diagnostic Studies: none  Treatments: surgery: Laparoscopic cholecystectomy with cholangiogram  Discharge Exam: Blood pressure 138/74, pulse 78, temperature 97.7 F (36.5 C), temperature source Oral, resp. rate 16, height 5\' 6"  (1.676 m), weight 113 lb 9.6 oz (51.529 kg), SpO2 94.00%.   Disposition: 01-Home or Self Care   Future Appointments Provider Department Dept Phone   05/25/2012 9:10 AM Adolph Pollack, MD [facility], [place] [phone]       Medication List    TAKE these medications       aspirin 81 MG tablet  Take 81 mg by mouth daily.     budesonide-formoterol 80-4.5 MCG/ACT inhaler  Commonly known as:  SYMBICORT  Inhale 1 puff into the lungs 2 (two) times daily.     carvedilol 25 MG tablet  Commonly known as:  COREG  Take 12.5 mg by mouth 2 (two) times daily.     famotidine 20 MG tablet  Commonly known as:  PEPCID  Take 20 mg by mouth 2 (two) times daily as needed for heartburn.     HYDROcodone-acetaminophen 5-325 MG per tablet  Commonly known as:  NORCO/VICODIN  Take 1-2 tablets by mouth every 4 (four) hours as needed.     LORazepam 0.5 MG tablet  Commonly known as:  ATIVAN  Take 0.25 mg by mouth at bedtime as needed (for sleep).     losartan 50 MG tablet  Commonly known as:  COZAAR  Take 50 mg by mouth every  morning.     timolol 0.5 % ophthalmic solution  Commonly known as:  TIMOPTIC  Place 1 drop into the right eye daily.         Signed: Adolph Pollack 05/12/2012, 9:33 AM

## 2012-05-13 ENCOUNTER — Emergency Department (HOSPITAL_COMMUNITY)
Admission: EM | Admit: 2012-05-13 | Discharge: 2012-05-13 | Disposition: A | Discharge status: Home / Self Care | Payer: Medicare Other | Attending: Emergency Medicine | Admitting: Emergency Medicine

## 2012-05-13 ENCOUNTER — Emergency Department (HOSPITAL_COMMUNITY): Payer: Medicare Other

## 2012-05-13 ENCOUNTER — Telehealth (INDEPENDENT_AMBULATORY_CARE_PROVIDER_SITE_OTHER): Payer: Self-pay | Admitting: *Deleted

## 2012-05-13 ENCOUNTER — Encounter (HOSPITAL_COMMUNITY): Payer: Self-pay | Admitting: Emergency Medicine

## 2012-05-13 DIAGNOSIS — Z9089 Acquired absence of other organs: Secondary | ICD-10-CM | POA: Insufficient documentation

## 2012-05-13 DIAGNOSIS — J45901 Unspecified asthma with (acute) exacerbation: Secondary | ICD-10-CM | POA: Insufficient documentation

## 2012-05-13 DIAGNOSIS — I10 Essential (primary) hypertension: Secondary | ICD-10-CM | POA: Insufficient documentation

## 2012-05-13 DIAGNOSIS — Z79899 Other long term (current) drug therapy: Secondary | ICD-10-CM | POA: Insufficient documentation

## 2012-05-13 DIAGNOSIS — R109 Unspecified abdominal pain: Secondary | ICD-10-CM | POA: Insufficient documentation

## 2012-05-13 DIAGNOSIS — Z7982 Long term (current) use of aspirin: Secondary | ICD-10-CM | POA: Insufficient documentation

## 2012-05-13 DIAGNOSIS — H9190 Unspecified hearing loss, unspecified ear: Secondary | ICD-10-CM | POA: Insufficient documentation

## 2012-05-13 DIAGNOSIS — R5082 Postprocedural fever: Secondary | ICD-10-CM | POA: Insufficient documentation

## 2012-05-13 DIAGNOSIS — R110 Nausea: Secondary | ICD-10-CM | POA: Insufficient documentation

## 2012-05-13 DIAGNOSIS — Z8719 Personal history of other diseases of the digestive system: Secondary | ICD-10-CM | POA: Insufficient documentation

## 2012-05-13 DIAGNOSIS — Z8701 Personal history of pneumonia (recurrent): Secondary | ICD-10-CM | POA: Insufficient documentation

## 2012-05-13 DIAGNOSIS — F329 Major depressive disorder, single episode, unspecified: Secondary | ICD-10-CM | POA: Insufficient documentation

## 2012-05-13 DIAGNOSIS — K219 Gastro-esophageal reflux disease without esophagitis: Secondary | ICD-10-CM | POA: Insufficient documentation

## 2012-05-13 DIAGNOSIS — R059 Cough, unspecified: Secondary | ICD-10-CM | POA: Insufficient documentation

## 2012-05-13 DIAGNOSIS — R0602 Shortness of breath: Secondary | ICD-10-CM | POA: Insufficient documentation

## 2012-05-13 DIAGNOSIS — F3289 Other specified depressive episodes: Secondary | ICD-10-CM | POA: Insufficient documentation

## 2012-05-13 DIAGNOSIS — R05 Cough: Secondary | ICD-10-CM | POA: Insufficient documentation

## 2012-05-13 DIAGNOSIS — Z8669 Personal history of other diseases of the nervous system and sense organs: Secondary | ICD-10-CM | POA: Insufficient documentation

## 2012-05-13 DIAGNOSIS — Z8739 Personal history of other diseases of the musculoskeletal system and connective tissue: Secondary | ICD-10-CM | POA: Insufficient documentation

## 2012-05-13 LAB — CBC WITH DIFFERENTIAL/PLATELET
Eosinophils Absolute: 0 10*3/uL (ref 0.0–0.7)
Eosinophils Relative: 0 % (ref 0–5)
Lymphs Abs: 0.8 10*3/uL (ref 0.7–4.0)
MCH: 32.2 pg (ref 26.0–34.0)
MCHC: 33.6 g/dL (ref 30.0–36.0)
MCV: 95.9 fL (ref 78.0–100.0)
Platelets: 163 10*3/uL (ref 150–400)
RBC: 4.13 MIL/uL — ABNORMAL LOW (ref 4.22–5.81)
RDW: 12.6 % (ref 11.5–15.5)

## 2012-05-13 LAB — LIPASE, BLOOD: Lipase: 17 U/L (ref 11–59)

## 2012-05-13 LAB — COMPREHENSIVE METABOLIC PANEL
ALT: 27 U/L (ref 0–53)
Calcium: 9 mg/dL (ref 8.4–10.5)
GFR calc Af Amer: 89 mL/min — ABNORMAL LOW (ref >90)
Glucose, Bld: 129 mg/dL — ABNORMAL HIGH (ref 70–99)
Sodium: 133 mEq/L — ABNORMAL LOW (ref 135–145)
Total Protein: 6.7 g/dL (ref 6.0–8.3)

## 2012-05-13 LAB — URINALYSIS, ROUTINE W REFLEX MICROSCOPIC
Hgb urine dipstick: NEGATIVE
Leukocytes, UA: NEGATIVE
Nitrite: NEGATIVE
Specific Gravity, Urine: 1.017 (ref 1.005–1.030)
Urobilinogen, UA: 0.2 mg/dL (ref 0.0–1.0)

## 2012-05-13 MED ORDER — ACETAMINOPHEN 500 MG PO TABS
ORAL_TABLET | ORAL | Status: AC
  Administered 2012-05-13: 1000 mg
  Filled 2012-05-13: qty 2

## 2012-05-13 MED ORDER — ONDANSETRON HCL 4 MG/2ML IJ SOLN
4.0000 mg | Freq: Once | INTRAMUSCULAR | Status: DC
  Filled 2012-05-13: qty 2

## 2012-05-13 MED ORDER — VANCOMYCIN HCL IN DEXTROSE 1-5 GM/200ML-% IV SOLN
1000.0000 mg | Freq: Once | INTRAVENOUS | Status: DC
  Filled 2012-05-13: qty 200

## 2012-05-13 MED ORDER — IOHEXOL 300 MG/ML  SOLN
80.0000 mL | Freq: Once | INTRAMUSCULAR | Status: AC | PRN
  Administered 2012-05-13: 80 mL via INTRAVENOUS

## 2012-05-13 MED ORDER — SODIUM CHLORIDE 0.9 % IV BOLUS (SEPSIS): 500.0000 mL | Freq: Once | INTRAVENOUS | Status: DC

## 2012-05-13 MED ORDER — SULFAMETHOXAZOLE-TRIMETHOPRIM 800-160 MG PO TABS: 1.0000 | ORAL_TABLET | Freq: Two times a day (BID) | ORAL | Status: AC

## 2012-05-13 MED ORDER — IOHEXOL 300 MG/ML  SOLN: 50.0000 mL | Freq: Once | INTRAMUSCULAR | Status: DC | PRN

## 2012-05-13 NOTE — ED Notes (Signed)
Per pt, had chole on tues, tolerated surgery well-woke up with fever after nap-some nausea after taking pain pill-surgeon advised ED eval fever

## 2012-05-13 NOTE — Telephone Encounter (Signed)
Daughter called to state that patient is running a fever of 102F and has been having episodes of nausea and dry heaving today.  Daughter is taking patient to [facility] ED at this time.

## 2012-05-13 NOTE — ED Notes (Signed)
Patient transported to X-ray 

## 2012-05-13 NOTE — ED Provider Notes (Signed)
History     CSN: [id number]  Arrival date & time 05/13/12  1649   First MD Initiated Contact with Patient 05/13/12 1715      Chief Complaint  Patient presents with  . Fever    (Consider location/radiation/quality/duration/timing/severity/associated sxs/prior treatment) HPI Pt had lap chole on tues with no immediate complication. States he has had pain since the surgery at the port site near umbilicus. Woke from nap today with chills and fever of 101. Admits to mild SOB and cough but unchanged since his surgery. +nausea. No V/D. No urinary symptoms Past Medical History  Diagnosis Date  . Hypertension   . Bronchitis   . Bruises easily     on arms  . Hiatal hernia   . Acid reflux   . Gallstones   . Hearing loss   . Glaucoma   . Depression   . Pneumonia     hx of  . Asthma     as child  . Shortness of breath     with exercise  . Arthritis     Past Surgical History  Procedure Laterality Date  . Ercp  04/22/12  . Arm fracture surgery  1996 - approximate    right  . Lithotripsy  1984 - approximate  . Appendectomy      about 50 years ago, does not remember exact date  . Kidney stone removed      x2  . Eye surgery Bilateral 2005    cataracts  . Cholecystectomy N/A 05/10/2012    Procedure: LAPAROSCOPIC CHOLECYSTECTOMY WITH INTRAOPERATIVE CHOLANGIOGRAM;  Surgeon: Adolph Pollack, MD;  Location: [facility] ORS;  Service: General;  Laterality: N/A;    Family History  Problem Relation Age of Onset  . Heart attack Mother   . Hypertension Sister   . Heart disease Brother     History  Substance Use Topics  . Smoking status: Never Smoker   . Smokeless tobacco: Never Used  . Alcohol Use: No      Review of Systems  Constitutional: Positive for fever and chills.  HENT: Negative for congestion, sore throat, rhinorrhea and neck pain.   Respiratory: Positive for cough and shortness of breath. Negative for wheezing.   Cardiovascular: Negative for chest pain, palpitations and  leg swelling.  Gastrointestinal: Positive for nausea and abdominal pain. Negative for vomiting, diarrhea and constipation.  Genitourinary: Negative for dysuria and flank pain.  Musculoskeletal: Negative for myalgias and back pain.  Skin: Positive for color change and wound. Negative for rash.  Neurological: Negative for dizziness, syncope, weakness, numbness and headaches.  All other systems reviewed and are negative.    Allergies  Biaxin; Hctz; and Levaquin  Home Medications   Current Outpatient Rx  Name  Route  Sig  Dispense  Refill  . budesonide-formoterol (SYMBICORT) 80-4.5 MCG/ACT inhaler   Inhalation   Inhale 1 puff into the lungs 2 (two) times daily.         . carvedilol (COREG) 25 MG tablet   Oral   Take 12.5 mg by mouth 2 (two) times daily.          . famotidine (PEPCID) 20 MG tablet   Oral   Take 20 mg by mouth 2 (two) times daily as needed for heartburn.         Marland Kitchen HYDROcodone-acetaminophen (NORCO/VICODIN) 5-325 MG per tablet   Oral   Take 1-2 tablets by mouth every 4 (four) hours as needed.   30 tablet   0   .  LORazepam (ATIVAN) 0.5 MG tablet   Oral   Take 0.25 mg by mouth at bedtime as needed (for sleep).          . losartan (COZAAR) 50 MG tablet   Oral   Take 50 mg by mouth every morning.          . timolol (TIMOPTIC) 0.5 % ophthalmic solution   Right Eye   Place 1 drop into the right eye daily.          Marland Kitchen aspirin 81 MG tablet   Oral   Take 81 mg by mouth daily.           BP 131/63  Pulse 95  Temp(Src) 102.6 F (39.2 C) (Rectal)  Resp 23  SpO2 92%  Physical Exam  Nursing note and vitals reviewed. Constitutional: He is oriented to person, place, and time. He appears well-developed and well-nourished. No distress.  HENT:  Head: Normocephalic and atraumatic.  Mouth/Throat: Oropharynx is clear and moist. No oropharyngeal exudate.  Eyes: EOM are normal. Pupils are equal, round, and reactive to light.  Neck: Normal range of  motion. Neck supple.  Cardiovascular: Normal rate and regular rhythm.   Pulmonary/Chest: Effort normal and breath sounds normal. No respiratory distress. He has no wheezes. He has no rales. He exhibits no tenderness.  Abdominal: Soft. Bowel sounds are normal. He exhibits no distension and no mass. There is tenderness (abd is tender focally over site of port insertion at umbilicus. the rest of the abd is soft and non-tender. no rebound or guarding. bruising and surrounding redness and warmth surrounding umbilicus. no mass appreciated. ). There is no rebound and no guarding.  Musculoskeletal: Normal range of motion. He exhibits no edema and no tenderness.  Neurological: He is alert and oriented to person, place, and time.  5/5 motor in all ext, sensation intact  Skin: Skin is warm and dry. No rash noted. No erythema.  Psychiatric: He has a normal mood and affect. His behavior is normal.    ED Course  Procedures (including critical care time)  Labs Reviewed  CBC WITH DIFFERENTIAL - Abnormal; Notable for the following:    WBC 14.6 (*)    RBC 4.13 (*)    Neutrophils Relative 83 (*)    Neutro Abs 12.1 (*)    Lymphocytes Relative 6 (*)    Monocytes Absolute 1.7 (*)    All other components within normal limits  COMPREHENSIVE METABOLIC PANEL - Abnormal; Notable for the following:    Sodium 133 (*)    Glucose, Bld 129 (*)    Albumin 3.1 (*)    Total Bilirubin 2.0 (*)    GFR calc non Af Amer 77 (*)    GFR calc Af Amer 89 (*)    All other components within normal limits  URINALYSIS, ROUTINE W REFLEX MICROSCOPIC - Abnormal; Notable for the following:    APPearance CLOUDY (*)    All other components within normal limits  LIPASE, BLOOD   Dg Chest 2 View  05/13/2012  *RADIOLOGY REPORT*  Clinical Data: Fever, shortness of breath and cough.  CHEST - 2 VIEW  Comparison: 05/06/2012 chest radiograph  Findings: A severe thoracic scoliosis is again noted. The cardiomediastinal silhouette is unchanged.  Tiny bilateral pleural effusions versus pleural thickening again noted. A large hiatal hernia is present. There is no evidence of definite airspace disease, pulmonary edema, or pneumothorax. No acute bony abnormalities identified.  IMPRESSION: No evidence of acute abnormality.  Stable tiny bilateral pleural  effusions versus pleural thickening.  Large hiatal hernia.   Original Report Authenticated By: Harmon Pier, M.D.    Ct Abdomen Pelvis W Contrast  05/13/2012  *RADIOLOGY REPORT*  Clinical Data: Fever and abdominal pain and elevated white blood count after cholecystectomy on 05/10/2012  CT ABDOMEN AND PELVIS WITH CONTRAST  Technique:  Multidetector CT imaging of the abdomen and pelvis was performed following the standard protocol during bolus administration of intravenous contrast.  Contrast: 80mL OMNIPAQUE IOHEXOL 300 MG/ML  SOLN  Comparison: None.  Findings: There is focal area of atelectasis in the right lower lobe.  The patient has a large hiatal hernia.  There is some free air around the hernia, consistent with recent abdominal surgery. The patient has a severe thoracolumbar scoliosis.  There are a few tiny bubbles of free air in the abdomen consistent with recent surgery.  Surgical packing and air are seen in the gallbladder fossa.  This is an expected appearance.  The liver, spleen, pancreas, adrenal glands, and kidneys are normal except for a 5 mm stone in the lower pole of the right kidney.  No hydronephrosis.  The patient has extensive diverticulosis throughout the colon.  No dilated loops of large or small bowel.  Tiny amount of free fluid in the pelvis.  No acute osseous abnormalities.  No abscesses.  IMPRESSION:  1.  Focal slight atelectasis in the left lower lobe. 2.  Postsurgical changes in the abdomen. 3.  Extensive diverticulosis.  No acute abnormality in the abdomen.   Original Report Authenticated By: Francene Boyers, M.D.      1. Fever postop       MDM   Pt continues to be very well  appearing in ED. Dr Ezzard Standing saw in ED. Unsure whether erythema represent normal post op or infection. Abd remain soft. No respiratory distress. Agrees with starting bactrim and having f/u with surgeon this week. Pt encouraged to return immediately for worsening symptoms or concerns       Loren Racer, MD 05/13/12 2139

## 2012-05-13 NOTE — ED Notes (Addendum)
3 days post op cholecystomy via lapscopic. Today daughter after patient woke up from a nap,  noted skin hot checked for fever 101.9. Patient has been eating, drinking & voiding without problem. However, today just ate breakfast, given 1/2 tab. Of vicodin around 9:00am for pain around 11:00am-1400hrs.  c/o nausea.  Noted dressing x4  The only dressing showing signs of infection is @ the umbilicus area is red but no drainage noted.

## 2012-05-25 ENCOUNTER — Ambulatory Visit (INDEPENDENT_AMBULATORY_CARE_PROVIDER_SITE_OTHER): Payer: Medicare Other | Admitting: General Surgery

## 2012-05-25 DIAGNOSIS — Z9889 Other specified postprocedural states: Secondary | ICD-10-CM

## 2012-05-25 NOTE — Patient Instructions (Signed)
Activities as tolerated. Do not eat too much spicy or fatty food at one time.

## 2012-05-25 NOTE — Progress Notes (Signed)
Procedure:  Laparoscopic cholecystectomy with cholangiogram  Date:  05/10/2012  Pathology:  Chronic cholecystitis and cholelithiasis  History:  He is here with his daughter for his first postoperative visit. He had some fever postoperatively and was seen in the emergency department but the workup was negative. He feels fine now. He is tolerating his diet. His bowels are moving.  Exam: General- Is in NAD. Abdomen-soft, all incisions are clean and intact.  Assessment:  Doing well post laparoscopic cholecystectomy.  Plan:  Resume activities as tolerated. Dietary instructions were given to him. Return visit as needed.

## 2012-06-01 ENCOUNTER — Encounter (INDEPENDENT_AMBULATORY_CARE_PROVIDER_SITE_OTHER): Payer: Self-pay

## 2012-06-01 ENCOUNTER — Encounter (INDEPENDENT_AMBULATORY_CARE_PROVIDER_SITE_OTHER): Payer: Medicare Other | Admitting: General Surgery

## 2012-06-02 ENCOUNTER — Encounter (INDEPENDENT_AMBULATORY_CARE_PROVIDER_SITE_OTHER): Payer: Self-pay

## 2012-06-13 ENCOUNTER — Encounter (INDEPENDENT_AMBULATORY_CARE_PROVIDER_SITE_OTHER): Payer: Self-pay

## 2013-04-05 ENCOUNTER — Telehealth: Payer: Self-pay

## 2013-05-30 NOTE — Telephone Encounter (Signed)
Close  

## 2013-12-30 IMAGING — RF DG CHOLANGIOGRAM OPERATIVE
1 series · 4 of 4 positions shown · non-contrast
Comparison: None.

CLINICAL DATA: Cholelithiasis

INTRAOPERATIVE CHOLANGIOGRAM
TECHNIQUE: Cholangiographic images from the C-arm fluoroscopic
device were submitted for interpretation post-operatively.  Please
see the procedural report for the amount of contrast and the
fluoroscopy time utilized.

[Series 1: run · 4 of 115 frames shown]
[frame 18/115]
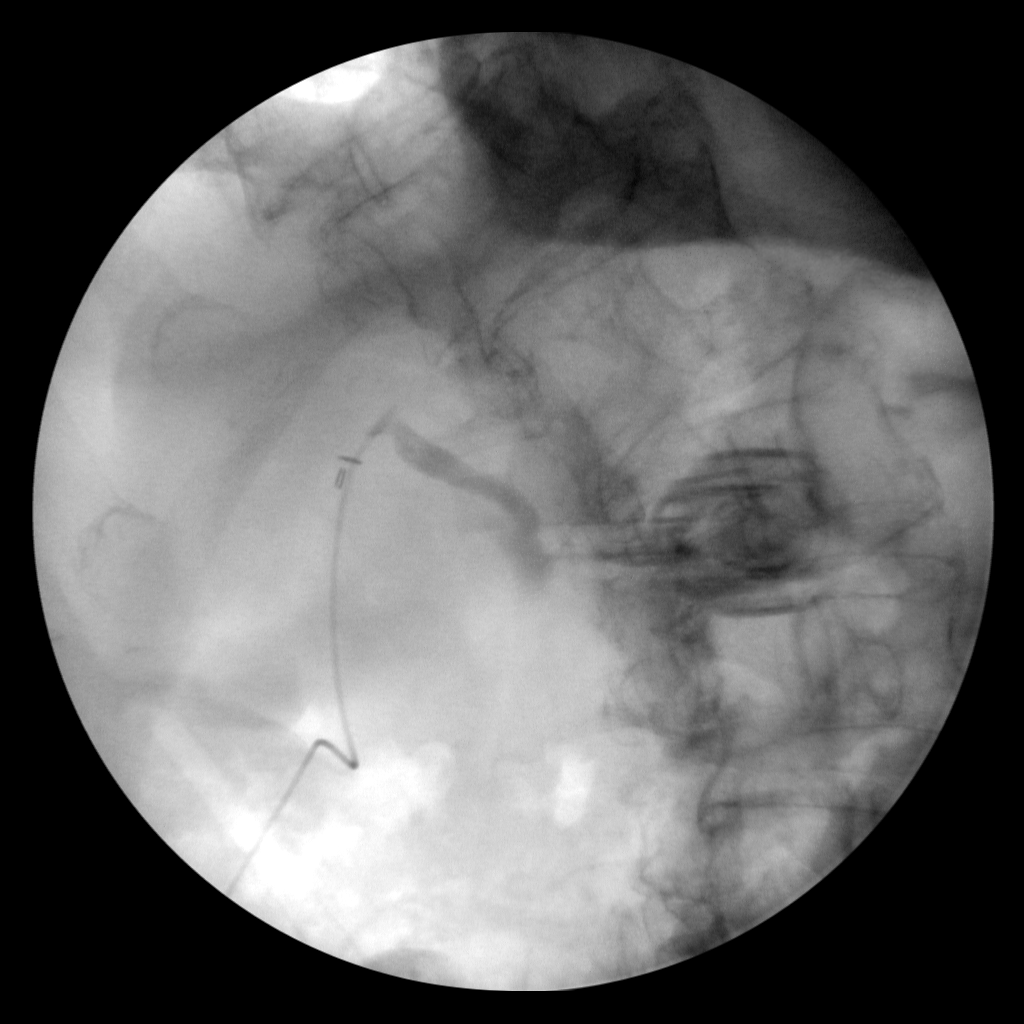
[frame 26/115]
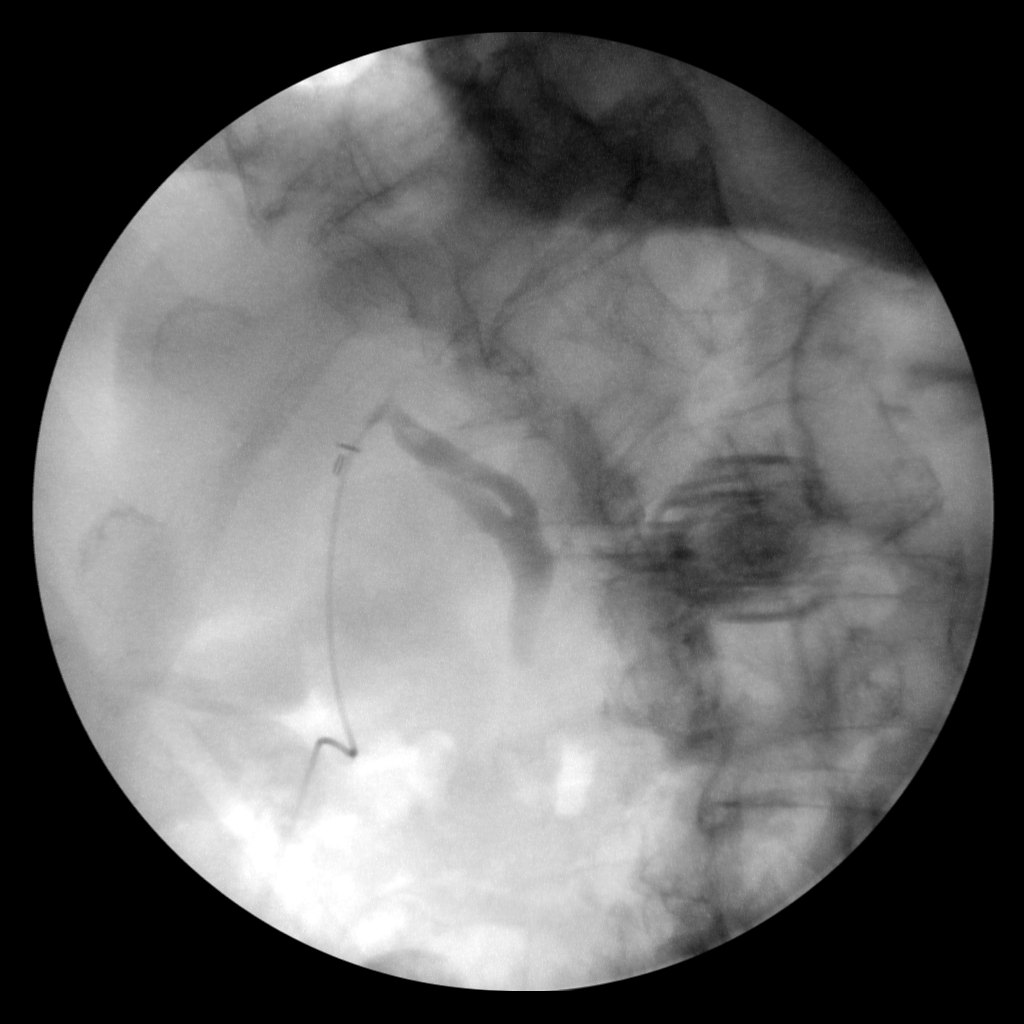
[frame 58/115]
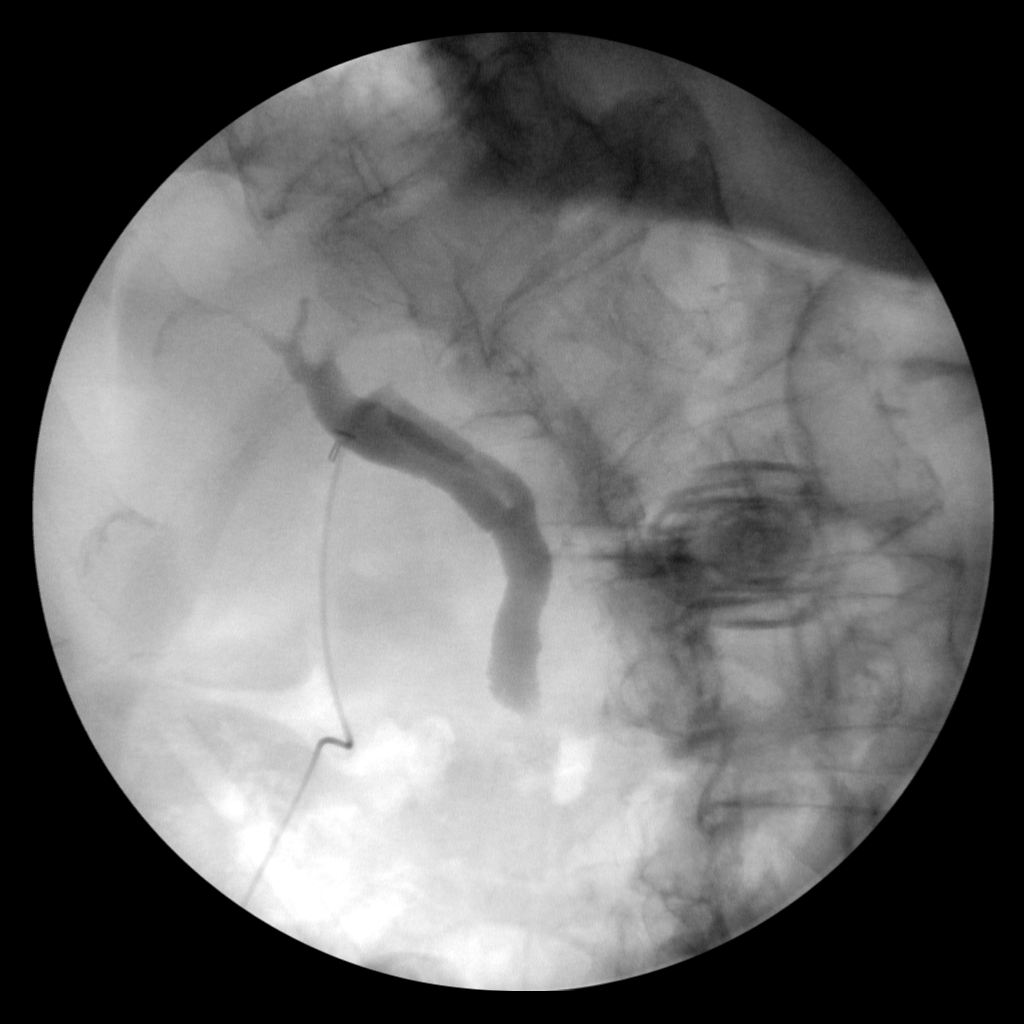
[frame 98/115]
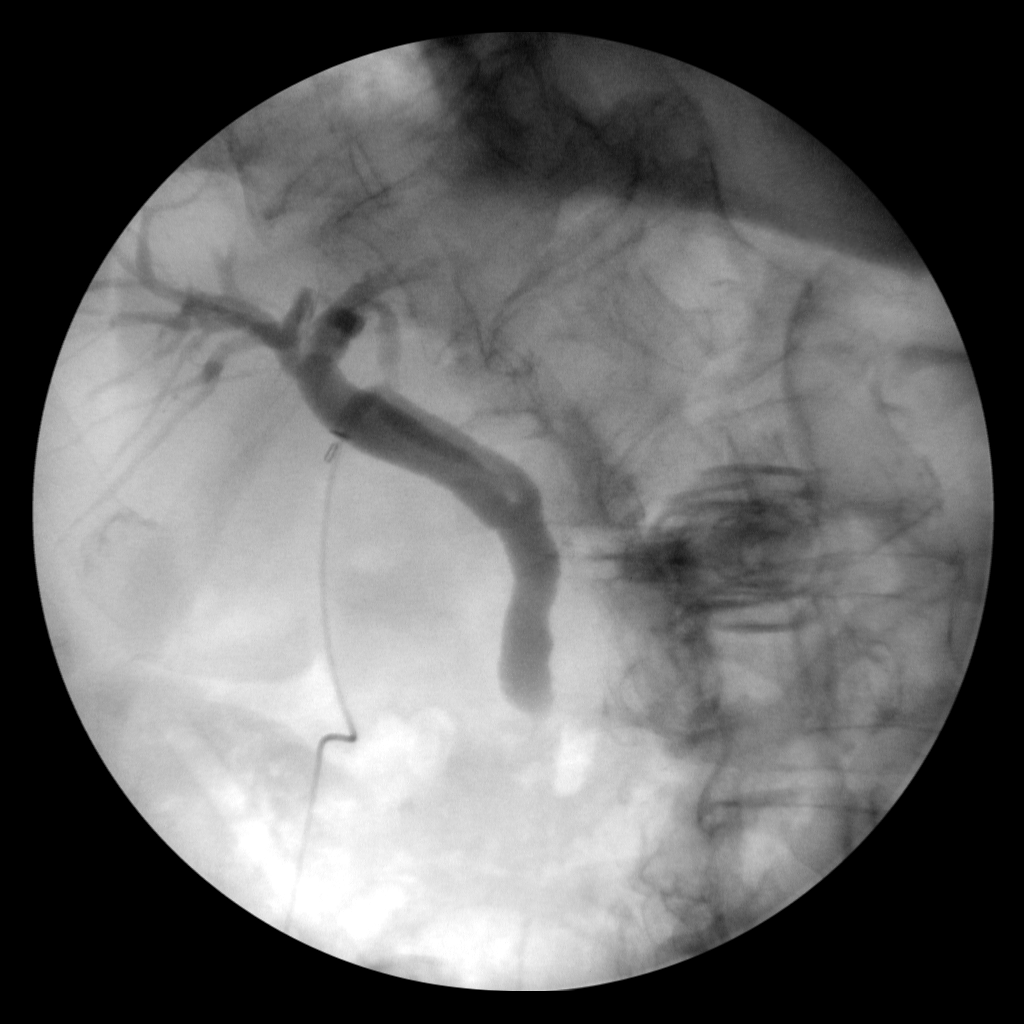

[4 of 4 positions shown; findings below may reference images not displayed]

FINDINGS: Contrast fills the biliary tree and duodenum compatible
with patency.  The common bile duct, however is dilated and there
is only a wisp of contrast crossing the ampulla of Vater. Narrowing
at the ampulla is smooth.  No filling defects in the common bile
duct to suggest common duct stones.
IMPRESSION: The biliary tree is patent and there is no evidence of common bile
duct stones.  There is however dilatation of the common bile duct
and smooth narrowing at the ampulla.

## 2014-01-02 IMAGING — CT CT ABD-PELV W/ CM
1 of 3 series · 14 of 32 positions shown, 19 images · IV contrast (OMNIPAQUE 300)
Comparison: None.

CLINICAL DATA: Fever and abdominal pain and elevated white blood
count after cholecystectomy on 05/10/2012

CT ABDOMEN AND PELVIS WITH CONTRAST
TECHNIQUE: Multidetector CT imaging of the abdomen and pelvis was
performed following the standard protocol during bolus
administration of intravenous contrast.
Contrast: 80mL OMNIPAQUE IOHEXOL 300 MG/ML  SOLN

[Series 2: abd/pel with · axial · 0.67mm/px · z∈[+1214,+1559]mm · 14 of 79 slices shown, 19 images]
[im 5/79  soft-tissue]
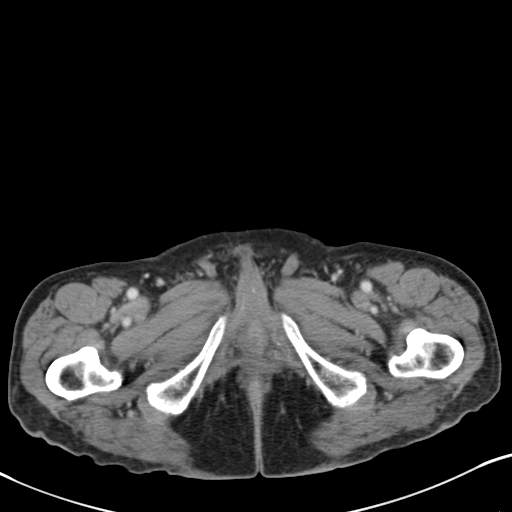
[im 5/79  bone]
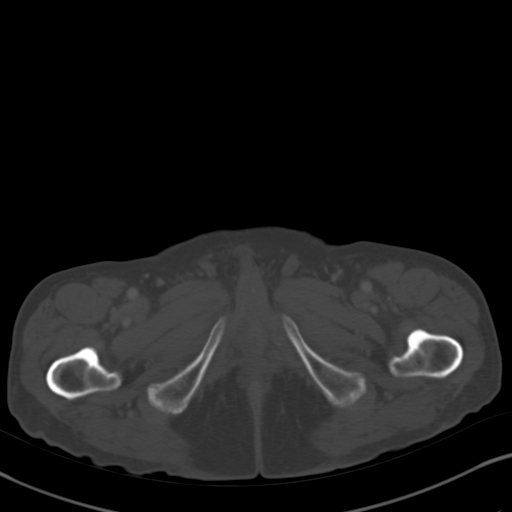
[im 13/79  soft-tissue]
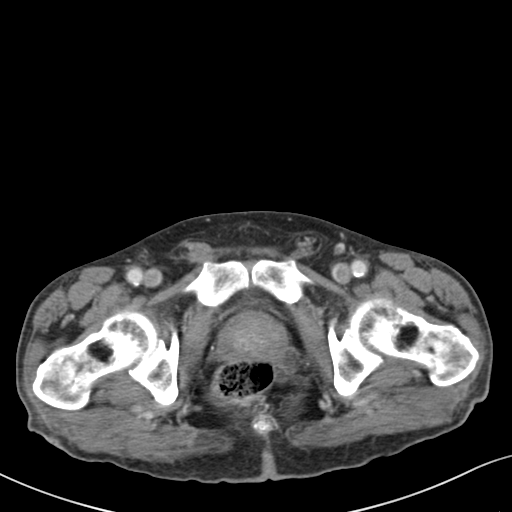
[im 17/79  soft-tissue]
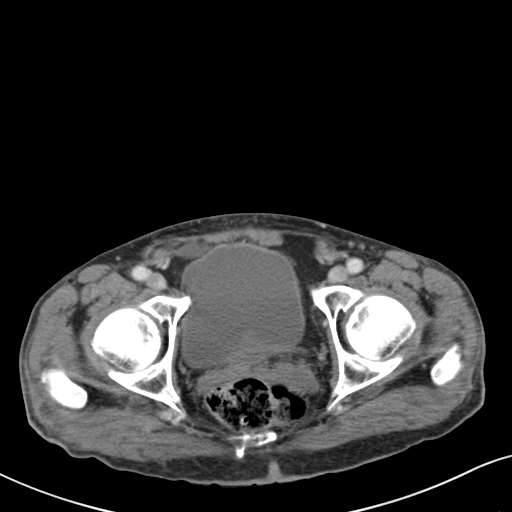
[im 21/79  soft-tissue]
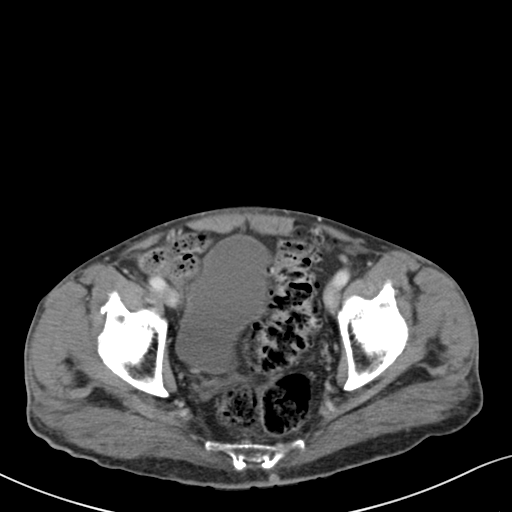
[im 29/79  soft-tissue]
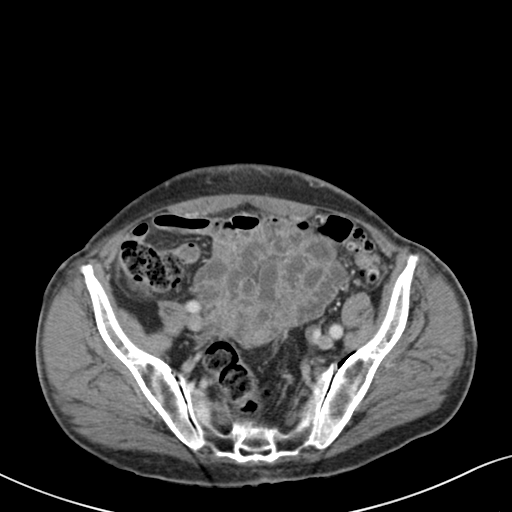
[im 33/79  soft-tissue]
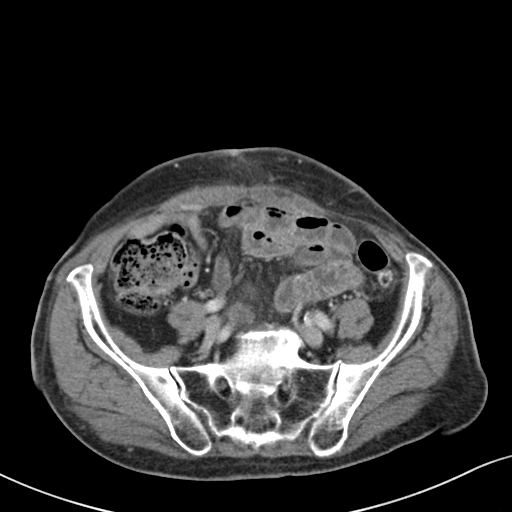
[im 42/79  soft-tissue]
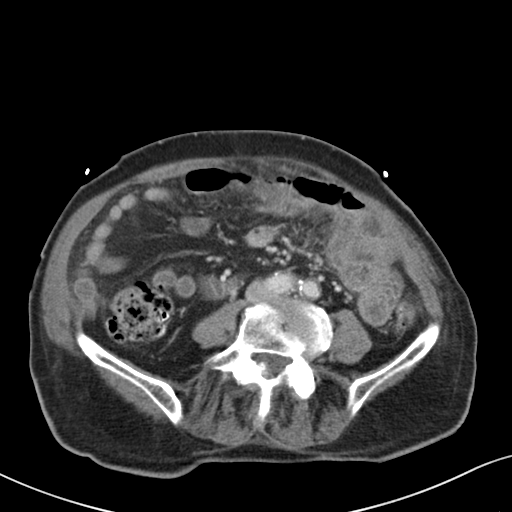
[im 46/79  soft-tissue]
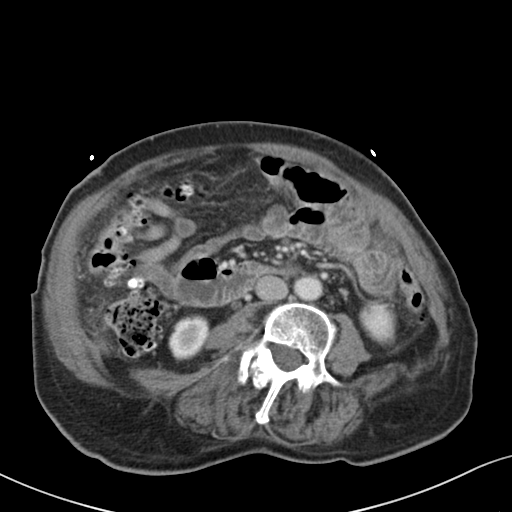
[im 50/79  soft-tissue]
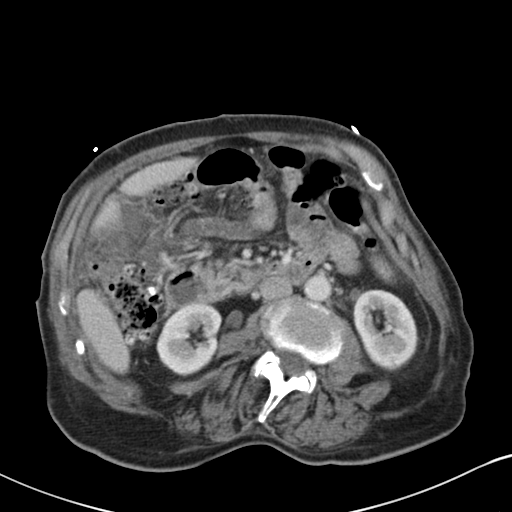
[im 50/79  bone]
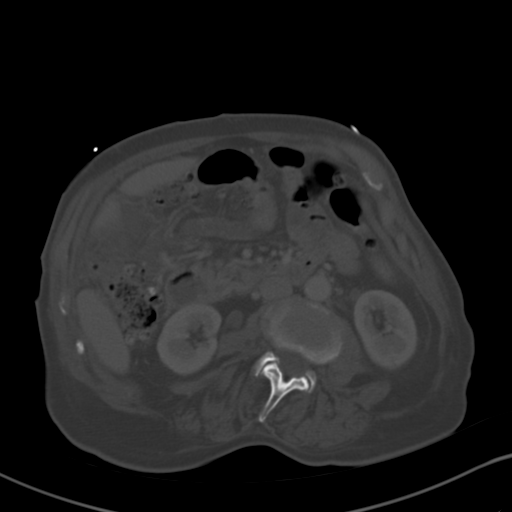
[im 58/79  soft-tissue]
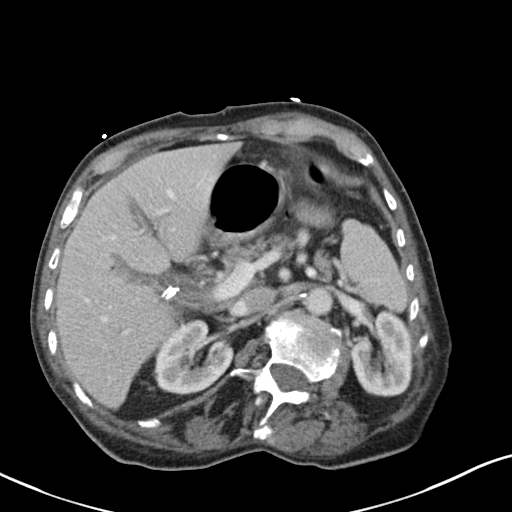
[im 62/79  soft-tissue]
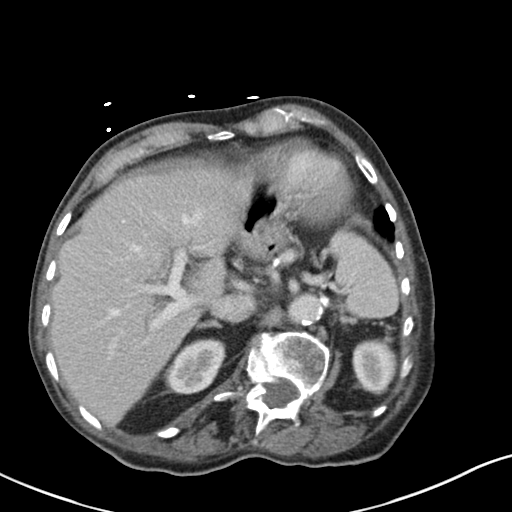
[im 62/79  lung]
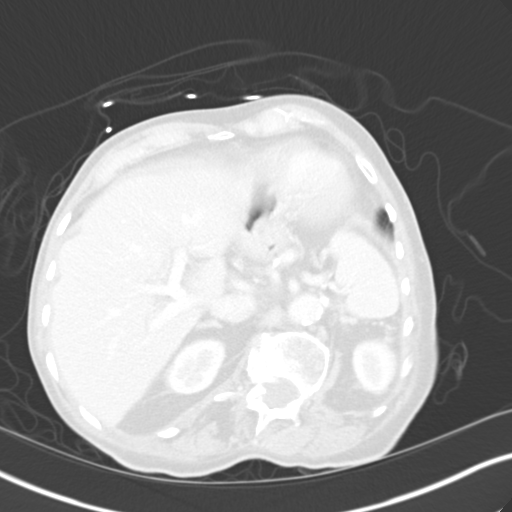
[im 66/79  soft-tissue]
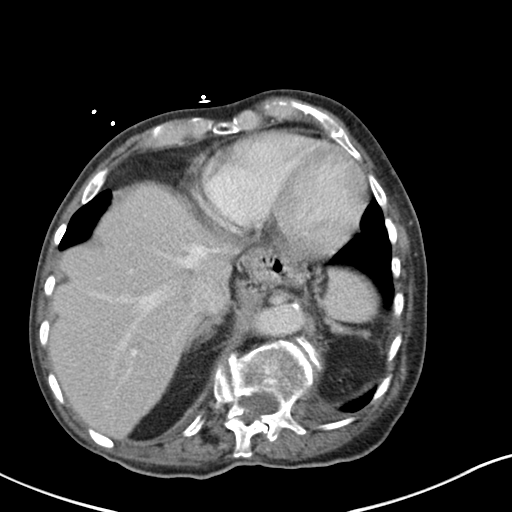
[im 66/79  lung]
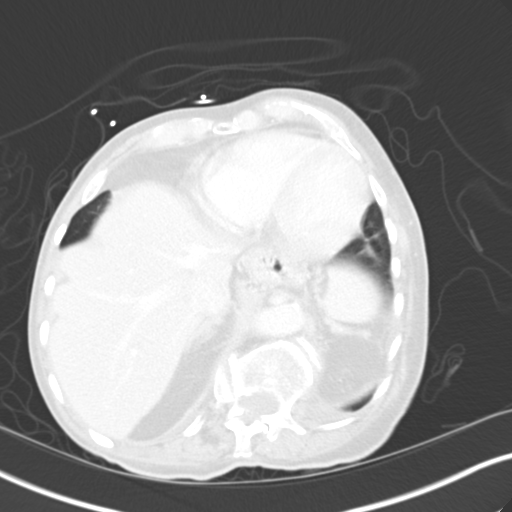
[im 70/79  lung]
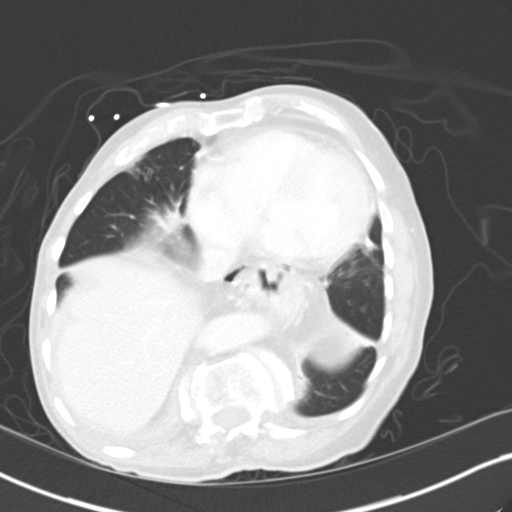
[im 74/79  soft-tissue]
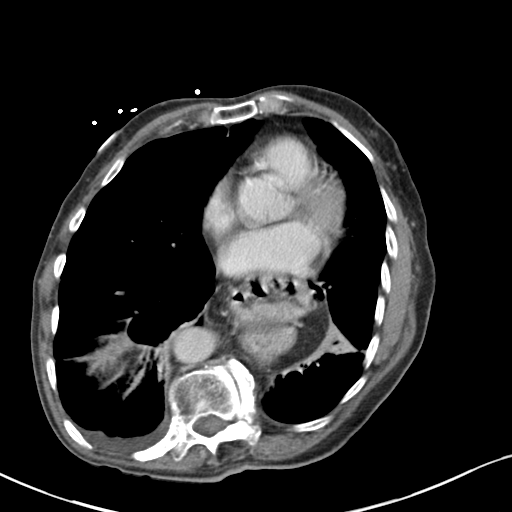
[im 74/79  lung]
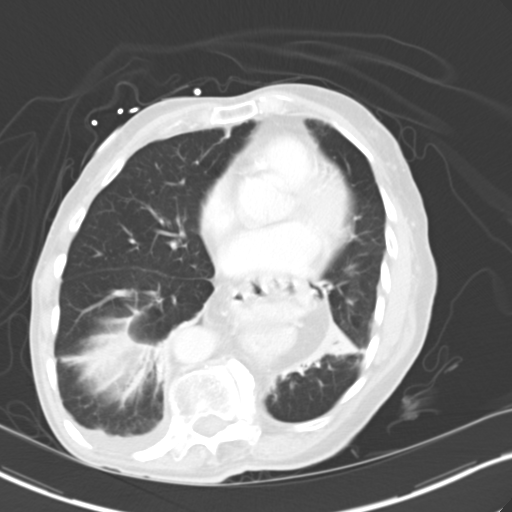

[14 of 32 positions shown; findings below may reference images not displayed]

FINDINGS: There is focal area of atelectasis in the right lower
lobe.  The patient has a large hiatal hernia.  There is some free
air around the hernia, consistent with recent abdominal surgery.
The patient has a severe thoracolumbar scoliosis.

There are a few tiny bubbles of free air in the abdomen consistent
with recent surgery.  Surgical packing and air are seen in the
gallbladder fossa.  This is an expected appearance.

The liver, spleen, pancreas, adrenal glands, and kidneys are normal
except for a 5 mm stone in the lower pole of the right kidney.  No
hydronephrosis.

The patient has extensive diverticulosis throughout the colon.  No
dilated loops of large or small bowel.  Tiny amount of free fluid
in the pelvis.  No acute osseous abnormalities.  No abscesses.
IMPRESSION: 1.  Focal slight atelectasis in the left lower lobe.
2.  Postsurgical changes in the abdomen.
3.  Extensive diverticulosis.  No acute abnormality in the abdomen.

## 2014-01-02 IMAGING — CR DG CHEST 2V
2 series · 2 of 2 positions shown · non-contrast
Comparison: 05/06/2012 chest radiograph

CLINICAL DATA: Fever, shortness of breath and cough.

CHEST - 2 VIEW

[w chest lat]
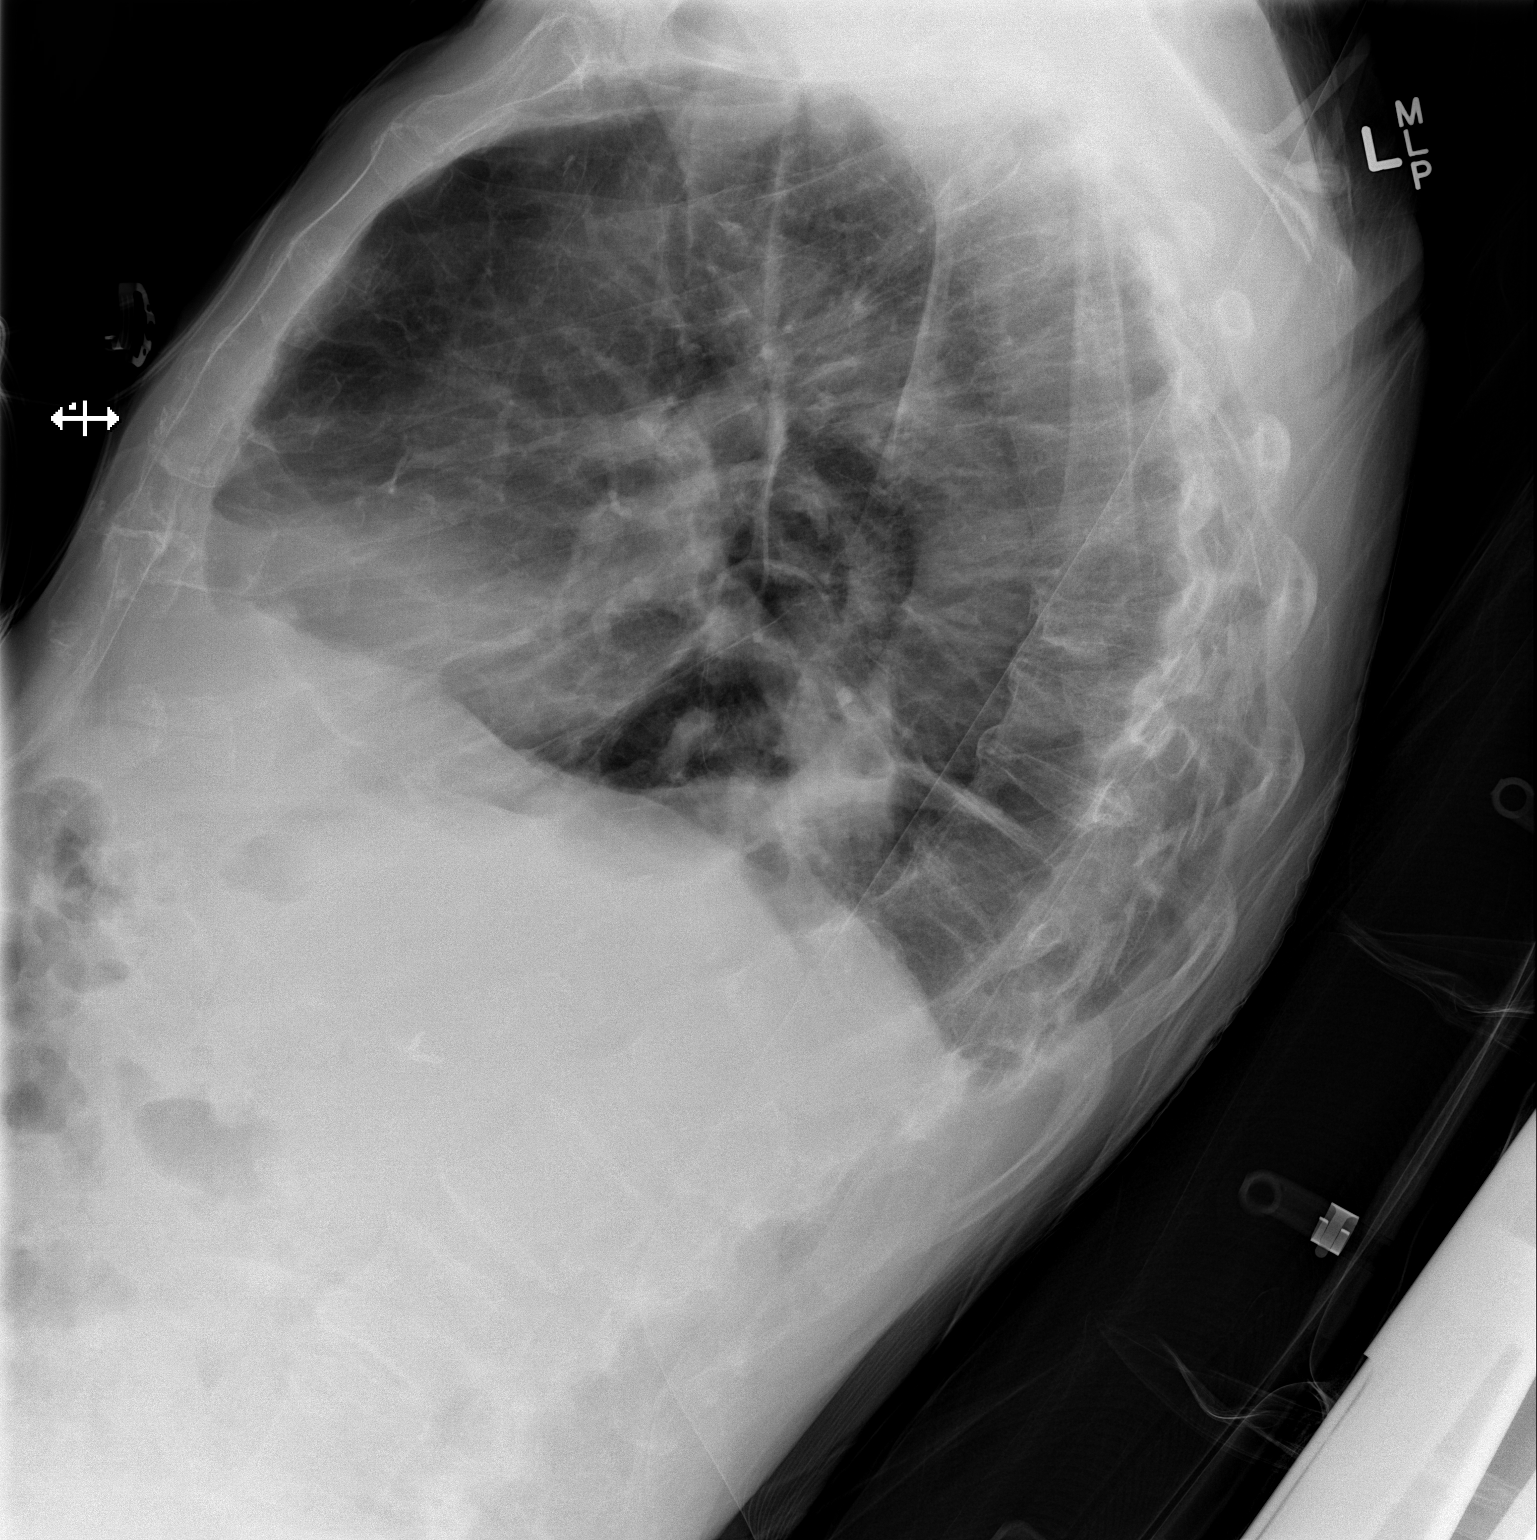

[x chest ap]
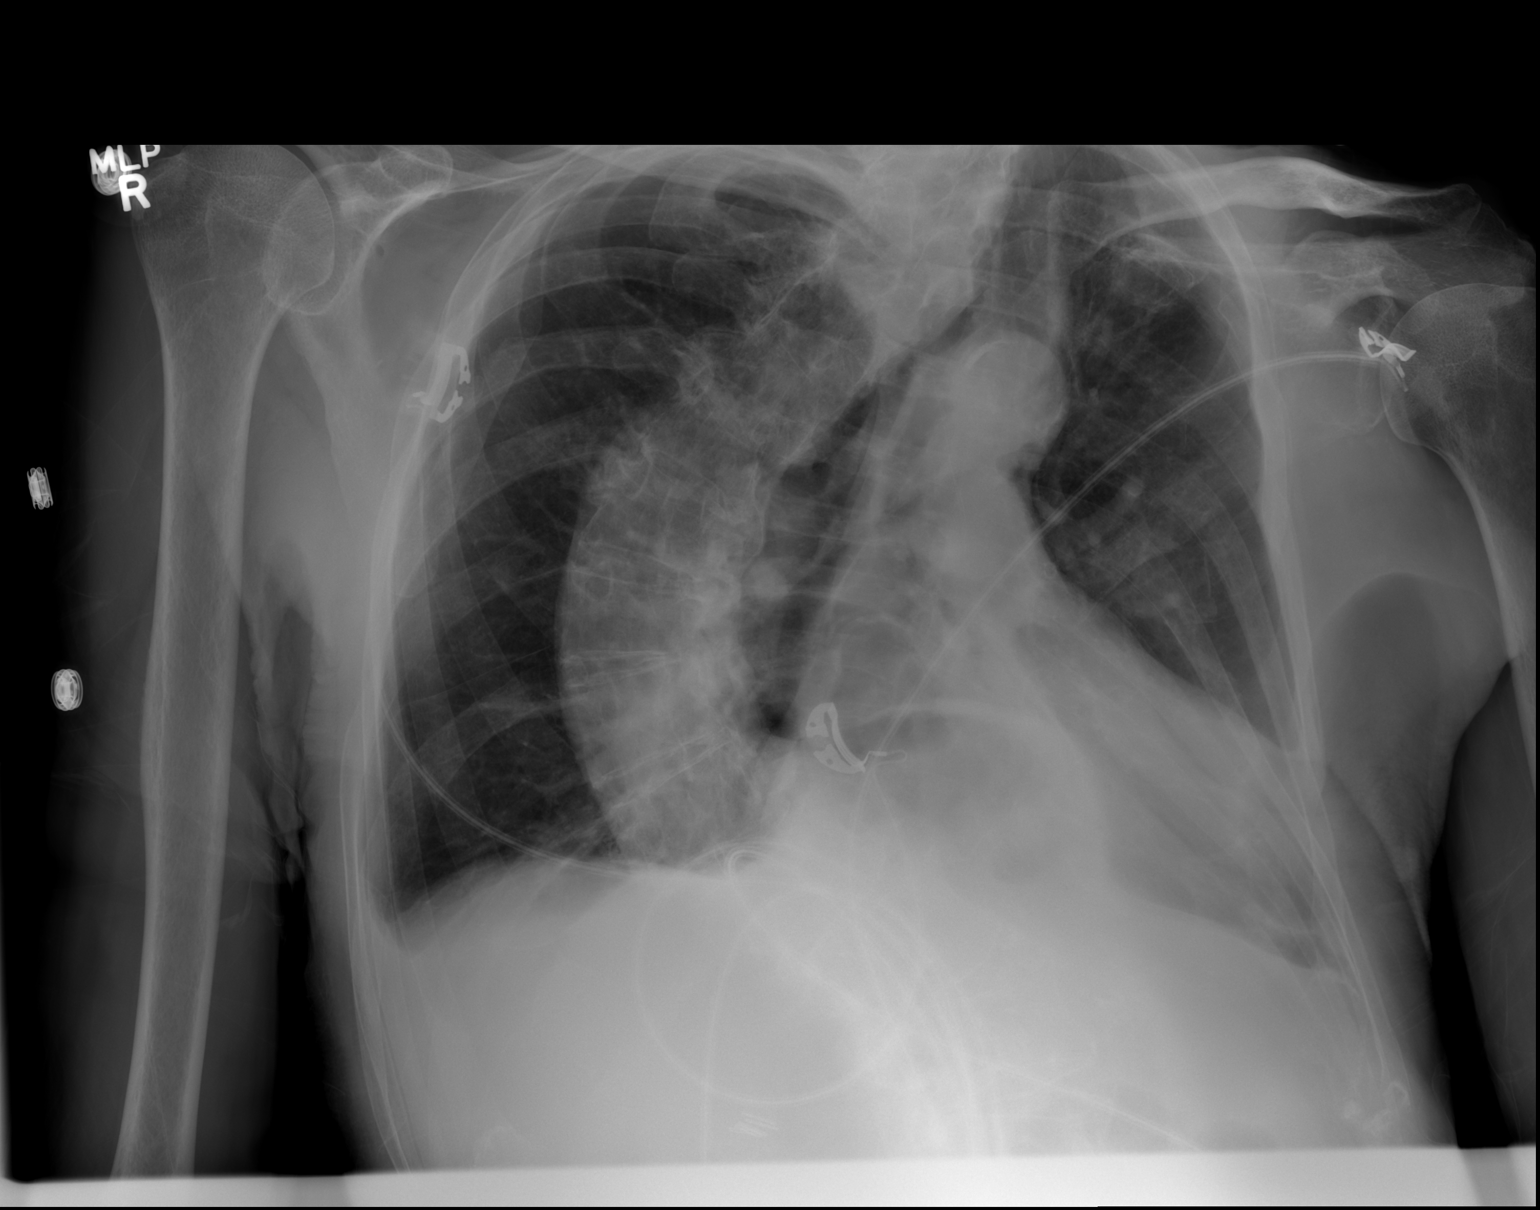

[2 of 2 positions shown; findings below may reference images not displayed]

FINDINGS: A severe thoracic scoliosis is again noted.
The cardiomediastinal silhouette is unchanged.
Tiny bilateral pleural effusions versus pleural thickening again
noted.
A large hiatal hernia is present.
There is no evidence of definite airspace disease, pulmonary edema,
or pneumothorax.
No acute bony abnormalities identified.
IMPRESSION: No evidence of acute abnormality.

Stable tiny bilateral pleural effusions versus pleural thickening.

Large hiatal hernia.

## 2020-08-30 DEATH — deceased
